# Patient Record
Sex: Female | Born: 1950 | Race: Black or African American | Hispanic: No | Marital: Single | Smoking: Never smoker
Health system: Southern US, Community
[De-identification: ages and names within clinical notes are randomized; demographics above are authoritative.]

## PROBLEM LIST (undated history)

## (undated) DIAGNOSIS — M1990 Unspecified osteoarthritis, unspecified site: Secondary | ICD-10-CM

## (undated) DIAGNOSIS — M109 Gout, unspecified: Secondary | ICD-10-CM

## (undated) HISTORY — PX: TONSILLECTOMY: SUR1361

## (undated) HISTORY — DX: Unspecified osteoarthritis, unspecified site: M19.90

---

## 2011-09-01 ENCOUNTER — Other Ambulatory Visit: Payer: Self-pay

## 2011-09-01 ENCOUNTER — Emergency Department (HOSPITAL_BASED_OUTPATIENT_CLINIC_OR_DEPARTMENT_OTHER)
Admission: EM | Admit: 2011-09-01 | Discharge: 2011-09-01 | Disposition: A | Discharge status: Home / Self Care | Payer: BC Managed Care – PPO | Attending: Emergency Medicine | Admitting: Emergency Medicine

## 2011-09-01 ENCOUNTER — Encounter: Payer: Self-pay | Admitting: *Deleted

## 2011-09-01 DIAGNOSIS — R112 Nausea with vomiting, unspecified: Secondary | ICD-10-CM | POA: Insufficient documentation

## 2011-09-01 DIAGNOSIS — R197 Diarrhea, unspecified: Secondary | ICD-10-CM | POA: Insufficient documentation

## 2011-09-01 DIAGNOSIS — R42 Dizziness and giddiness: Secondary | ICD-10-CM | POA: Insufficient documentation

## 2011-09-01 LAB — URINALYSIS, ROUTINE W REFLEX MICROSCOPIC
Glucose, UA: NEGATIVE mg/dL
Hgb urine dipstick: NEGATIVE
Leukocytes, UA: NEGATIVE
Protein, ur: NEGATIVE mg/dL
Specific Gravity, Urine: 1.011 (ref 1.005–1.030)
pH: 6 (ref 5.0–8.0)

## 2011-09-01 LAB — BASIC METABOLIC PANEL
CO2: 27 mEq/L (ref 19–32)
Calcium: 9.1 mg/dL (ref 8.4–10.5)
Chloride: 105 mEq/L (ref 96–112)
Glucose, Bld: 104 mg/dL — ABNORMAL HIGH (ref 70–99)
Potassium: 3.7 mEq/L (ref 3.5–5.1)
Sodium: 140 mEq/L (ref 135–145)

## 2011-09-01 LAB — CBC
Hemoglobin: 13.6 g/dL (ref 12.0–15.0)
Platelets: 178 10*3/uL (ref 150–400)
RBC: 4.61 MIL/uL (ref 3.87–5.11)
WBC: 6.1 10*3/uL (ref 4.0–10.5)

## 2011-09-01 MED ORDER — ONDANSETRON HCL 4 MG/2ML IJ SOLN
4.0000 mg | Freq: Once | INTRAMUSCULAR | Status: AC
  Administered 2011-09-01: 4 mg via INTRAVENOUS
  Filled 2011-09-01: qty 2

## 2011-09-01 MED ORDER — SODIUM CHLORIDE 0.9 % IV SOLN
Freq: Once | INTRAVENOUS | Status: AC
  Administered 2011-09-01: 15:00:00 via INTRAVENOUS

## 2011-09-01 MED ORDER — MECLIZINE HCL 25 MG PO TABS: 25.0000 mg | ORAL_TABLET | Freq: Four times a day (QID) | ORAL | Status: AC

## 2011-09-01 MED ORDER — ONDANSETRON HCL 4 MG PO TABS: 4.0000 mg | ORAL_TABLET | Freq: Four times a day (QID) | ORAL | Status: AC

## 2011-09-01 MED ORDER — MECLIZINE HCL 25 MG PO TABS
25.0000 mg | ORAL_TABLET | Freq: Once | ORAL | Status: AC
  Administered 2011-09-01: 25 mg via ORAL
  Filled 2011-09-01: qty 1

## 2011-09-01 NOTE — ED Provider Notes (Signed)
History     CSN: [id number] Arrival date & time: 09/01/2011  1:22 PM   First MD Initiated Contact with Patient 09/01/11 1506      Chief Complaint  Patient presents with  . Nausea  . Dizziness    (Consider location/radiation/quality/duration/timing/severity/associated sxs/prior treatment) HPI Comments: Pt c/o nause and vomiting x 6 and diarrhea x 2 since this AM which started  After feeling dizzy.  She attributes her sxs to going out to eat at a [employer] last PM with many co-workers.  She has not contacted any of them to see if they are also sick.  She denies any fever.  URI sxs ~ 2 weeks ago.  She has no other sxs.  The history is provided by the patient. No language interpreter was used.    History reviewed. No pertinent past medical history.  History reviewed. No pertinent past surgical history.  No family history on file.  History  Substance Use Topics  . Smoking status: Never Smoker   . Smokeless tobacco: Not on file  . Alcohol Use: No    OB History    Grav Para Term Preterm Abortions TAB SAB Ect Mult Living                  Review of Systems  Constitutional: Negative for fever and diaphoresis.  Cardiovascular: Negative for chest pain, palpitations and leg swelling.  Gastrointestinal: Positive for nausea, vomiting and diarrhea.  Neurological: Positive for dizziness. Negative for syncope, speech difficulty and weakness.    Allergies  Review of patient's allergies indicates no known allergies.  Home Medications  No current outpatient prescriptions on file.  BP 127/66  Pulse 50  Temp(Src) 97.4 F (36.3 C) (Oral)  Resp 20  Ht 5\' 8"  (1.727 m)  Wt 221 lb (100.245 kg)  BMI 33.60 kg/m2  SpO2 100%  Physical Exam  Nursing note and vitals reviewed. Constitutional: She is oriented to person, place, and time. She appears well-developed and well-nourished. No distress.  HENT:  Head: Normocephalic and atraumatic.  Eyes: EOM are normal.  Neck:  Normal range of motion.  Cardiovascular: Regular rhythm, S1 normal, S2 normal, normal heart sounds, intact distal pulses and normal pulses.   No extrasystoles are present. Bradycardia present.  PMI is not displaced.   Pulmonary/Chest: Effort normal and breath sounds normal. No respiratory distress. She has no wheezes. She has no rales. She exhibits no tenderness.  Abdominal: Soft. She exhibits no distension and no mass. There is no tenderness. There is no rebound and no guarding.  Musculoskeletal: Normal range of motion.  Neurological: She is alert and oriented to person, place, and time. No cranial nerve deficit or sensory deficit. Coordination normal. GCS eye subscore is 4. GCS verbal subscore is 5. GCS motor subscore is 6.  Skin: Skin is warm and dry.  Psychiatric: She has a normal mood and affect. Judgment normal.    ED Course  Procedures (including critical care time)  Labs Reviewed - No data to display No results found.   No diagnosis found.   Date: 09/01/2011  Rate: 58  Rhythm: sinus bradycardia  QRS Axis: normal  Intervals: normal  ST/T Wave abnormalities: normal  Conduction Disutrbances:none  Narrative Interpretation:   Old EKG Reviewed: none available    MDM   6:16 PMdiscussed lab work with pt.  If her sxs worsen or change she is to return here or see her MD.        Worthy Rancher, PA  09/01/11 1817 

## 2011-09-01 NOTE — ED Notes (Signed)
MD at bedside. Al Decant, PAC in to talk with pt.)

## 2011-09-01 NOTE — ED Notes (Signed)
Pt states ate at [employer] yesterday for lunch- states had dizziness this morning and with loose BM then had n/v since this morning- not sure if it is something she ate

## 2011-09-01 NOTE — ED Notes (Signed)
Pt ambulated to bathroom without difficulty. CCU/A obtained.

## 2011-09-01 NOTE — ED Notes (Signed)
MD at bedside. Raiford Noble, PA in with pt)

## 2011-09-01 NOTE — ED Notes (Signed)
MD at bedside. 

## 2011-09-01 NOTE — ED Provider Notes (Signed)
Medical screening examination/treatment/procedure(s) were performed by non-physician practitioner and as supervising physician I was immediately available for consultation/collaboration.  Kiersten Coss, MD 09/01/11 2350 

## 2015-07-07 ENCOUNTER — Ambulatory Visit (INDEPENDENT_AMBULATORY_CARE_PROVIDER_SITE_OTHER): Payer: BC Managed Care – PPO | Admitting: Internal Medicine

## 2015-07-07 ENCOUNTER — Encounter: Payer: Self-pay | Admitting: Internal Medicine

## 2015-07-07 VITALS — BP 116/78 | HR 82 | Temp 99.0°F | Wt 207.0 lb

## 2015-07-07 DIAGNOSIS — M10072 Idiopathic gout, left ankle and foot: Secondary | ICD-10-CM

## 2015-07-07 NOTE — Progress Notes (Signed)
HPI  Pt presents to the clinic today to establish care and for management of the conditions listed below. She is transferring care from [facility] [place].  Flu: never Tetanus: 2014 Zostovax: never Pap Smear: 04/2014 Mammogram: 04/2014 Colon Screening: 04/2014 Vision Screening: as needed Dentist: as needed  Arthritis: From the gout in her left ankle. She has taken Indomethacin intermittently over the last year.  She also c/o left ankle pain and swelling. This started about 1 month ago. She did see her previous PCP for the same and was diagnosed with gout. She was given Prednisone, Zanaflex and Colchicine. She reports she only took the Colchicine twice and never took the Prednisone or Zanaflex because she was concerned about the side effects. The pain is getting worse and interfering with her ability to walk. The pain is worse with weight bearing and she can barely stand to put her shoe on.  Past Medical History  Diagnosis Date  . Arthritis     Current Outpatient Prescriptions  Medication Sig Dispense Refill  . colchicine 0.6 MG tablet Take 1.2 mg by mouth daily. Take 2 tablets upon acute onset    . indomethacin (INDOCIN) 25 MG capsule Take 25-50 mg by mouth 2 (two) times daily with a meal. Every 6 to 8 hours with food    . naproxen (NAPROSYN) 500 MG tablet TAKE 1 TABLET AS NEEDED EVERY 12 HRS WITH FOOD FOR 14 DAYS  0  . predniSONE (DELTASONE) 20 MG tablet Take 20 mg by mouth daily with breakfast. Take 3 tabs x 1 day, then 2 tabs x 3 days, 1 x 3, 1/2 x 2 then daily    . tiZANidine (ZANAFLEX) 4 MG tablet TAKE 1 TABLET AS NEEDED AT BEDTIME  1   No current facility-administered medications for this visit.    No Known Allergies  Family History  Problem Relation Age of Onset  . Arthritis Mother   . Kidney disease Mother   . Lung cancer Father   . Stroke Sister   . Hypertension Sister   . Lung cancer Brother   . Heart disease Brother     Social History   Social History   . Marital Status: Single    Spouse Name: N/A  . Number of Children: N/A  . Years of Education: N/A   Occupational History  . Not on file.   Social History Main Topics  . Smoking status: Never Smoker   . Smokeless tobacco: Not on file  . Alcohol Use: No  . Drug Use: No  . Sexual Activity: Not on file   Other Topics Concern  . Not on file   Social History Narrative    ROS:  Constitutional: Denies fever, malaise, fatigue, headache or abrupt weight changes.  Respiratory: Denies difficulty breathing, shortness of breath, cough or sputum production.   Cardiovascular: Denies chest pain, chest tightness, palpitations or swelling in the hands or feet.  Musculoskeletal: Pt reports joint pain and swelling.  Skin: Denies redness, rashes, lesions or ulcercations.  Neurological: Denies dizziness, difficulty with memory, difficulty with speech or problems with balance and coordination.  Psych: Denies anxiety, depression, SI/HI.  No other specific complaints in a complete review of systems (except as listed in HPI above).  PE: BP 116/78 mmHg  Pulse 82  Temp(Src) 99 F (37.2 C) (Oral)  Wt 207 lb (93.895 kg)  SpO2 98%  Wt Readings from Last 3 Encounters:  07/07/15 207 lb (93.895 kg)  09/01/11 221 lb (100.245 kg)  General: Appears her stated age, well developed, well nourished in NAD. Cardiovascular: Normal rate and rhythm. S1,S2 noted.  No murmur, rubs or gallops noted. Pedal pulse 2+ bilaterally.  Pulmonary/Chest: Normal effort and positive vesicular breath sounds. No respiratory distress. No wheezes, rales or ronchi noted.  Musculoskeletal: Decreased flexion, extension and rotation of the left ankle. 1+ swelling noted. Very tender to palpation. Her gait is slowed and she is walking with a limp. Neurological: Alert and oriented. Sensation intact to BLE. Psychiatric: She seems anxious today.  BMET    Component Value Date/Time   NA 140 09/01/2011 1524   K 3.7 09/01/2011 1524    CL 105 09/01/2011 1524   CO2 27 09/01/2011 1524   GLUCOSE 104* 09/01/2011 1524   BUN 12 09/01/2011 1524   CREATININE 0.90 09/01/2011 1524   CALCIUM 9.1 09/01/2011 1524   GFRNONAA 69* 09/01/2011 1524   GFRAA 80* 09/01/2011 1524    Lipid Panel  No results found for: CHOL, TRIG, HDL, CHOLHDL, VLDL, LDLCALC  CBC    Component Value Date/Time   WBC 6.1 09/01/2011 1524   RBC 4.61 09/01/2011 1524   HGB 13.6 09/01/2011 1524   HCT 40.0 09/01/2011 1524   PLT 178 09/01/2011 1524   MCV 86.8 09/01/2011 1524   MCH 29.5 09/01/2011 1524   MCHC 34.0 09/01/2011 1524   RDW 12.8 09/01/2011 1524    Hgb A1C No results found for: HGBA1C   Assessment and Plan:  Gout of left ankle:  I wanted to get a xray today but she declines as she had one last year from previous PCP, will request records Discussed the importance of taking the Prednisone to improve the pain in her ankles Once the pain is improved, we can check uric acid level and discuss other long term treatment options Keep the left ankle elevated  RTC in 2 week or if symptoms persist or worsen

## 2015-07-07 NOTE — Progress Notes (Signed)
Pre visit review using our clinic review tool, if applicable. No additional management support is needed unless otherwise documented below in the visit note. 

## 2015-07-07 NOTE — Patient Instructions (Signed)
Gout °Gout is when your joints become red, sore, and swell (inflamed). This is caused by the buildup of uric acid crystals in the joints. Uric acid is a chemical that is normally in the blood. If the level of uric acid gets too high in the blood, these crystals form in your joints and tissues. Over time, these crystals can form into masses near the joints and tissues. These masses can destroy bone and cause the bone to look misshapen (deformed). °HOME CARE  °· Do not take aspirin for pain. °· Only take medicine as told by your doctor. °· Rest the joint as much as you can. When in bed, keep sheets and blankets off painful areas. °· Keep the sore joints raised (elevated). °· Put warm or cold packs on painful joints. Use of warm or cold packs depends on which works best for you. °· Use crutches if the painful joint is in your leg. °· Drink enough fluids to keep your pee (urine) clear or pale yellow. Limit alcohol, sugary drinks, and drinks with fructose in them. °· Follow your diet instructions. Pay careful attention to how much protein you eat. Include fruits, vegetables, whole grains, and fat-free or low-fat milk products in your daily diet. Talk to your doctor or dietitian about the use of coffee, vitamin C, and cherries. These may help lower uric acid levels. °· Keep a healthy body weight. °GET HELP RIGHT AWAY IF:  °· You have watery poop (diarrhea), throw up (vomit), or have any side effects from medicines. °· You do not feel better in 24 hours, or you are getting worse. °· Your joint becomes suddenly more tender, and you have chills or a fever. °MAKE SURE YOU:  °· Understand these instructions. °· Will watch your condition. °· Will get help right away if you are not doing well or get worse. °Document Released: 07/31/2008 Document Revised: 03/08/2014 Document Reviewed: 06/04/2012 °ExitCare® Patient Information ©2015 [employer]. This information is not intended to replace advice given to you by your health care  provider. Make sure you discuss any questions you have with your health care provider. ° °

## 2015-07-18 ENCOUNTER — Encounter: Payer: Self-pay | Admitting: Internal Medicine

## 2015-07-18 ENCOUNTER — Ambulatory Visit (INDEPENDENT_AMBULATORY_CARE_PROVIDER_SITE_OTHER): Payer: BC Managed Care – PPO | Admitting: Internal Medicine

## 2015-07-18 VITALS — BP 118/70 | HR 63 | Temp 98.3°F | Wt 202.5 lb

## 2015-07-18 DIAGNOSIS — M10072 Idiopathic gout, left ankle and foot: Secondary | ICD-10-CM

## 2015-07-18 LAB — COMPREHENSIVE METABOLIC PANEL
ALBUMIN: 4 g/dL (ref 3.5–5.2)
ALK PHOS: 80 U/L (ref 39–117)
ALT: 18 U/L (ref 0–35)
AST: 19 U/L (ref 0–37)
BUN: 18 mg/dL (ref 6–23)
CALCIUM: 9.6 mg/dL (ref 8.4–10.5)
CHLORIDE: 104 meq/L (ref 96–112)
CO2: 30 mEq/L (ref 19–32)
Creatinine, Ser: 1.09 mg/dL (ref 0.40–1.20)
GFR: 65.35 mL/min (ref >60.00)
Glucose, Bld: 62 mg/dL — ABNORMAL LOW (ref 70–99)
POTASSIUM: 4.8 meq/L (ref 3.5–5.1)
SODIUM: 139 meq/L (ref 135–145)
TOTAL PROTEIN: 7.3 g/dL (ref 6.0–8.3)
Total Bilirubin: 0.9 mg/dL (ref 0.2–1.2)

## 2015-07-18 LAB — URIC ACID: URIC ACID, SERUM: 6.1 mg/dL (ref 2.4–7.0)

## 2015-07-18 NOTE — Progress Notes (Signed)
Subjective:    Patient ID: Shirley Thornton, female    DOB: 12-08-1950, 64 y.o.   MRN: [id number]  HPI  Pt presents to the clinic today to follow up pain in her left ankle. She was told that she had gout in her foot. She did not want to take the Prednisone or Colchicine that was prescribed by UC because she was concerned about the side effects. I advised her to take the Prednisone as prescribed. She reports that her left ankle is feeling much better. She also started on a low purine diet.  Review of Systems  Past Medical History  Diagnosis Date  . Arthritis     Current Outpatient Prescriptions  Medication Sig Dispense Refill  . indomethacin (INDOCIN) 25 MG capsule Take 25-50 mg by mouth 2 (two) times daily with a meal. Every 6 to 8 hours with food     No current facility-administered medications for this visit.    No Known Allergies  Family History  Problem Relation Age of Onset  . Arthritis Mother   . Kidney disease Mother   . Lung cancer Father   . Stroke Sister   . Hypertension Sister   . Lung cancer Brother   . Heart disease Brother     Social History   Social History  . Marital Status: Single    Spouse Name: N/A  . Number of Children: N/A  . Years of Education: N/A   Occupational History  . Not on file.   Social History Main Topics  . Smoking status: Never Smoker   . Smokeless tobacco: Not on file  . Alcohol Use: No  . Drug Use: No  . Sexual Activity: Not Currently   Other Topics Concern  . Not on file   Social History Narrative     Constitutional: Denies fever, malaise, fatigue, headache or abrupt weight changes.  Respiratory: Denies difficulty breathing, shortness of breath, cough or sputum production.   Cardiovascular: Denies chest pain, chest tightness, palpitations or swelling in the hands or feet.  Musculoskeletal: Denies decreased range of motion, difficulty with gait, muscle pain or joint pain and swelling.  Skin: Denies redness, rashes,  lesions or ulcercations.   No other specific complaints in a complete review of systems (except as listed in HPI above).     Objective:   Physical Exam  BP 118/70 mmHg  Pulse 63  Temp(Src) 98.3 F (36.8 C) (Oral)  Wt 202 lb 8 oz (91.853 kg)  SpO2 98% Wt Readings from Last 3 Encounters:  07/18/15 202 lb 8 oz (91.853 kg)  07/07/15 207 lb (93.895 kg)  09/01/11 221 lb (100.245 kg)    General: Appears her stated age, well developed, well nourished in NAD. Skin: Warm, dry and intact. No rashes, lesions or ulcerations noted. Cardiovascular: Normal rate and rhythm. S1,S2 noted.  No murmur, rubs or gallops noted.  Pulmonary/Chest: Normal effort and positive vesicular breath sounds. No respiratory distress. No wheezes, rales or ronchi noted.  Musculoskeletal: Normal flexion, extension and rotation of the left ankle. No signs of joint swelling. No difficulty with gait.  Neurological: Alert and oriented. Sensation intact to BLE.   BMET    Component Value Date/Time   NA 140 09/01/2011 1524   K 3.7 09/01/2011 1524   CL 105 09/01/2011 1524   CO2 27 09/01/2011 1524   GLUCOSE 104* 09/01/2011 1524   BUN 12 09/01/2011 1524   CREATININE 0.90 09/01/2011 1524   CALCIUM 9.1 09/01/2011 1524   GFRNONAA 69*  09/01/2011 1524   GFRAA 80* 09/01/2011 1524    Lipid Panel  No results found for: CHOL, TRIG, HDL, CHOLHDL, VLDL, LDLCALC  CBC    Component Value Date/Time   WBC 6.1 09/01/2011 1524   RBC 4.61 09/01/2011 1524   HGB 13.6 09/01/2011 1524   HCT 40.0 09/01/2011 1524   PLT 178 09/01/2011 1524   MCV 86.8 09/01/2011 1524   MCH 29.5 09/01/2011 1524   MCHC 34.0 09/01/2011 1524   RDW 12.8 09/01/2011 1524    Hgb A1C No results found for: HGBA1C       Assessment & Plan:   Gout of left ankle:  Improved Will check uric acid level and BMET today Handout given on a low purine diet She has Indomethacin on hand if symptoms flare up again  Make an appt on your way out for your annual  exam

## 2015-07-18 NOTE — Patient Instructions (Signed)
Low-Purine Diet  Purines are compounds that affect the level of uric acid in your body. A low-purine diet is a diet that is low in purines. Eating a low-purine diet can prevent the level of uric acid in your body from getting too high and causing gout or kidney stones or both.  WHAT DO I NEED TO KNOW ABOUT THIS DIET?  · Choose low-purine foods. Examples of low-purine foods are listed in the next section.  · Drink plenty of fluids, especially water. Fluids can help remove uric acid from your body. Try to drink 8-16 cups (1.9-3.8 L) a day.  · Limit foods high in fat, especially saturated fat, as fat makes it harder for the body to get rid of uric acid. Foods high in saturated fat include pizza, cheese, ice cream, whole milk, fried foods, and gravies. Choose foods that are lower in fat and lean sources of protein. Use olive oil when cooking as it contains healthy fats that are not high in saturated fat.  · Limit alcohol. Alcohol interferes with the elimination of uric acid from your body. If you are having a gout attack, avoid all alcohol.  · Keep in mind that different people's bodies react differently to different foods. You will probably learn over time which foods do or do not affect you. If you discover that a food tends to cause your gout to flare up, avoid eating that food. You can more freely enjoy foods that do not cause problems. If you have any questions about a food item, talk to your dietitian or health care provider.  WHICH FOODS ARE LOW, MODERATE, AND HIGH IN PURINES?  The following is a list of foods that are low, moderate, and high in purines. You can eat any amount of the foods that are low in purines. You may be able to have small amounts of foods that are moderate in purines. Ask your health care provider how much of a food moderate in purines you can have. Avoid foods high in purines.  Grains  · Foods low in purines: Enriched white bread, pasta, rice, cake, cornbread, popcorn.  · Foods moderate in  purines: Whole-grain breads and cereals, wheat germ, bran, oatmeal. Uncooked oatmeal. Dry wheat bran or wheat germ.  · Foods high in purines: Pancakes, French toast, biscuits, muffins.  Vegetables  · Foods low in purines: All vegetables, except those that are moderate in purines.  · Foods moderate in purines: Asparagus, cauliflower, spinach, mushrooms, green peas.  Fruits  · All fruits are low in purines.  Meats and other Protein Foods  · Foods low in purines: Eggs, nuts, peanut butter.  · Foods moderate in purines: 80-90% lean beef, lamb, veal, pork, poultry, fish, eggs, peanut butter, nuts. Crab, lobster, oysters, and shrimp. Cooked dried beans, peas, and lentils.  · Foods high in purines: Anchovies, sardines, herring, mussels, tuna, codfish, scallops, trout, and haddock. Bacon. Organ meats (such as liver or kidney). Tripe. Game meat. Goose. Sweetbreads.  Dairy  · All dairy foods are low in purines. Low-fat and fat-free dairy products are best because they are low in saturated fat.  Beverages  · Drinks low in purines: Water, carbonated beverages, tea, coffee, cocoa.  · Drinks moderate in purines: Soft drinks and other drinks sweetened with high-fructose corn syrup. Juices. To find whether a food or drink is sweetened with high-fructose corn syrup, look at the ingredients list.  · Drinks high in purines: Alcoholic beverages (such as beer).  Condiments  · Foods   low in purines: Salt, herbs, olives, pickles, relishes, vinegar.  · Foods moderate in purines: Butter, margarine, oils, mayonnaise.  Fats and Oils  · Foods low in purines: All types, except gravies and sauces made with meat.  · Foods high in purines: Gravies and sauces made with meat.  Other Foods  · Foods low in purines: Sugars, sweets, gelatin. Cake. Soups made without meat.  · Foods moderate in purines: Meat-based or fish-based soups, broths, or bouillons. Foods and drinks sweetened with high-fructose corn syrup.  · Foods high in purines: High-fat desserts  (such as ice cream, cookies, cakes, pies, doughnuts, and chocolate).  Contact your dietitian for more information on foods that are not listed here.  Document Released: 02/16/2011 Document Revised: 10/27/2013 Document Reviewed: 09/28/2013  ExitCare® Patient Information ©2015 [employer]. This information is not intended to replace advice given to you by your health care provider. Make sure you discuss any questions you have with your health care provider.

## 2015-07-18 NOTE — Progress Notes (Signed)
Pre visit review using our clinic review tool, if applicable. No additional management support is needed unless otherwise documented below in the visit note. 

## 2015-07-29 ENCOUNTER — Telehealth: Payer: Self-pay | Admitting: Internal Medicine

## 2015-07-29 MED ORDER — INDOMETHACIN 25 MG PO CAPS: 25.0000 mg | ORAL_CAPSULE | Freq: Three times a day (TID) | ORAL | Status: DC

## 2015-07-29 NOTE — Telephone Encounter (Addendum)
Spoke to pt who states that she is out of indomethacin and is needing Rx for TID to [facility] ([facility] location). Ok to fill per [employer]

## 2015-07-29 NOTE — Addendum Note (Signed)
Addended by: Desmond Dike on: 07/29/2015 03:51 PM   Modules accepted: Orders

## 2015-07-29 NOTE — Telephone Encounter (Signed)
She has Indomethacin. I want her to take that TID over the weekend and let me know if not improved on Monday

## 2015-07-29 NOTE — Telephone Encounter (Signed)
Spoke to pt and advised per [place]. Pt states that she is also taking an additional medication that she was prescribed, before establishing with Rene Kocher, but is unaware of the name. She states that she read that one of them could cause heart attack and stroke and is wanting to confirm before taking. She is going to check in her car and contact office back.

## 2015-07-29 NOTE — Telephone Encounter (Signed)
[facility] - Client TELEPHONE ADVICE RECORD [facility] Patient Name: Shirley Thornton DOB: 03-Nov-1951 Initial Comment Caller states she is having problems with her gout on her left ankle. Nurse Assessment Nurse: Roderic Ovens, RN, Amy Date/Time Lamount Cohen Time): 07/29/2015 1:58:02 PM Confirm and document reason for call. If symptomatic, describe symptoms. ---SHE STATES THAT SHE IS LIMPING AND SHE IS NOT SURE WHAT IS GOING ON WITH HER GOUT BUT IT IS FLARED UP. IT BEGAN YESTERDAY HURTING. REDNESS AND SWELLING IN THE ANKLE. Has the patient traveled out of the country within the last 30 days? ---Not Applicable Does the patient require triage? ---Yes Related visit to physician within the last 2 weeks? ---No Does the PT have any chronic conditions? (i.e. diabetes, asthma, etc.) ---Yes List chronic conditions. ---GOUT Guidelines Guideline Title Affirmed Question Affirmed Notes Ankle Pain [1] SEVERE pain (e.g., excruciating, unable to walk) AND [2] not improved after 2 hours of pain medicine Final Disposition User See Physician within 4 Hours (or PCP triage) [place], RN, Amy Comments SHE IS WANTING SOMETHING CALLED IN FOR HER GOUT FLARE UP. 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 SHE STATES THAT SHE WILL CALL BACK AROUND 1500 TO SEE IF SHE NEEDS TO COME IN OR CAN GET SOMETHING CALL IN FOR HER. Referrals REFERRED TO PCP OFFICE Disagree/Comply: Comply

## 2015-08-08 ENCOUNTER — Encounter: Payer: Self-pay | Admitting: Internal Medicine

## 2015-08-08 ENCOUNTER — Ambulatory Visit (INDEPENDENT_AMBULATORY_CARE_PROVIDER_SITE_OTHER): Payer: BC Managed Care – PPO | Admitting: Internal Medicine

## 2015-08-08 VITALS — BP 122/80 | HR 60 | Temp 98.2°F | Wt 207.0 lb

## 2015-08-08 DIAGNOSIS — R600 Localized edema: Secondary | ICD-10-CM | POA: Diagnosis not present

## 2015-08-08 NOTE — Patient Instructions (Signed)
Peripheral Edema °You have swelling in your legs (peripheral edema). This swelling is due to excess accumulation of salt and water in your body. Edema may be a sign of heart, kidney or liver disease, or a side effect of a medication. It may also be due to problems in the leg veins. Elevating your legs and using special support stockings may be very helpful, if the cause of the swelling is due to poor venous circulation. Avoid long periods of standing, whatever the cause. °Treatment of edema depends on identifying the cause. Chips, pretzels, pickles and other salty foods should be avoided. Restricting salt in your diet is almost always needed. Water pills (diuretics) are often used to remove the excess salt and water from your body via urine. These medicines prevent the kidney from reabsorbing sodium. This increases urine flow. °Diuretic treatment may also result in lowering of potassium levels in your body. Potassium supplements may be needed if you have to use diuretics daily. Daily weights can help you keep track of your progress in clearing your edema. You should call your caregiver for follow up care as recommended. °SEEK IMMEDIATE MEDICAL CARE IF:  °· You have increased swelling, pain, redness, or heat in your legs. °· You develop shortness of breath, especially when lying down. °· You develop chest or abdominal pain, weakness, or fainting. °· You have a fever. °Document Released: 11/29/2004 Document Revised: 01/14/2012 Document Reviewed: 11/09/2009 °ExitCare® Patient Information ©2015 [employer]. This information is not intended to replace advice given to you by your health care provider. Make sure you discuss any questions you have with your health care provider. ° °

## 2015-08-08 NOTE — Progress Notes (Signed)
Subjective:    Patient ID: Shirley Thornton, female    DOB: 09-16-1951, 64 y.o.   MRN: [id number]  HPI  Pt presents to the clinic today with c/o left leg swelling. She thinks she is having another gout flare. She reports she was taking the Indomethacin which did provide some relief. After she stopped the Indomethacin, her symptoms returned. She has left ankle swelling. It seems worse at the end of the day. She has put an ice pack on her foot and kept it elevated with minimal relief.  Review of Systems  Past Medical History  Diagnosis Date  . Arthritis     Current Outpatient Prescriptions  Medication Sig Dispense Refill  . indomethacin (INDOCIN) 25 MG capsule Take 1-2 capsules (25-50 mg total) by mouth 3 (three) times daily with meals. Every 6 to 8 hours with food 90 capsule 0   No current facility-administered medications for this visit.    No Known Allergies  Family History  Problem Relation Age of Onset  . Arthritis Mother   . Kidney disease Mother   . Lung cancer Father   . Stroke Sister   . Hypertension Sister   . Lung cancer Brother   . Heart disease Brother     Social History   Social History  . Marital Status: Single    Spouse Name: N/A  . Number of Children: N/A  . Years of Education: N/A   Occupational History  . Not on file.   Social History Main Topics  . Smoking status: Never Smoker   . Smokeless tobacco: Not on file  . Alcohol Use: No  . Drug Use: No  . Sexual Activity: Not Currently   Other Topics Concern  . Not on file   Social History Narrative     Constitutional: Denies fever, malaise, fatigue, headache or abrupt weight changes.  Respiratory: Denies difficulty breathing, shortness of breath, cough or sputum production.   Cardiovascular: Pt reports swelling in her left leg. Denies chest pain, chest tightness, palpitations or swelling in the hands.  Musculoskeletal: Pt reports left ankle swelling. Denies decrease in range of motion,  difficulty with gait, muscle pain or joint pain.  Skin: Denies redness, rashes, lesions or ulcercations.   No other specific complaints in a complete review of systems (except as listed in HPI above).     Objective:   Physical Exam  Pulse 60  Temp(Src) 98.2 F (36.8 C) (Oral)  Wt 207 lb (93.895 kg)  SpO2 98%  Wt Readings from Last 3 Encounters:  08/08/15 207 lb (93.895 kg)  07/18/15 202 lb 8 oz (91.853 kg)  07/07/15 207 lb (93.895 kg)    General: Appears her stated age, well developed, well nourished in NAD. Heart: Regular rate and rhythm. Pedal pulse 2+ bilaterally. Skin: Warm, dry and intact. No rashes, lesions or ulcerations noted. Musculoskeletal: Normal flexion, extension and rotation of the left ankle. 1+ swelling of the ankle. 1+ pitting pretibial edema. No difficulty with gait.    BMET    Component Value Date/Time   NA 139 07/18/2015 0946   K 4.8 07/18/2015 0946   CL 104 07/18/2015 0946   CO2 30 07/18/2015 0946   GLUCOSE 62* 07/18/2015 0946   BUN 18 07/18/2015 0946   CREATININE 1.09 07/18/2015 0946   CALCIUM 9.6 07/18/2015 0946   GFRNONAA 69* 09/01/2011 1524   GFRAA 80* 09/01/2011 1524    Lipid Panel  No results found for: CHOL, TRIG, HDL, CHOLHDL, VLDL, LDLCALC  CBC  Component Value Date/Time   WBC 6.1 09/01/2011 1524   RBC 4.61 09/01/2011 1524   HGB 13.6 09/01/2011 1524   HCT 40.0 09/01/2011 1524   PLT 178 09/01/2011 1524   MCV 86.8 09/01/2011 1524   MCH 29.5 09/01/2011 1524   MCHC 34.0 09/01/2011 1524   RDW 12.8 09/01/2011 1524    Hgb A1C No results found for: HGBA1C       Assessment & Plan:   Swelling of left leg:  eRx for 1 par of TED hose Keep legs elevated I do not think this is related to her gout Advised her to take Indomethacin only for a gout flare  RTC as needed or if symptoms persist or worsen

## 2015-08-08 NOTE — Progress Notes (Signed)
Pre visit review using our clinic review tool, if applicable. No additional management support is needed unless otherwise documented below in the visit note. 

## 2015-08-09 ENCOUNTER — Telehealth: Payer: Self-pay

## 2015-08-09 NOTE — Telephone Encounter (Signed)
Pt left vm; pt took TED rx by medical supply and pt was advised those type hose are usually given after a surgery; pt has not had surgery and wants to know if there is a different type hose pt needs for left ankle re; to gout; pt request cb.

## 2015-08-09 NOTE — Telephone Encounter (Signed)
No I want her to have TED hose.

## 2015-08-09 NOTE — Telephone Encounter (Signed)
Left message on voicemail.

## 2015-08-26 ENCOUNTER — Encounter: Payer: BC Managed Care – PPO | Admitting: Internal Medicine

## 2015-09-12 ENCOUNTER — Other Ambulatory Visit: Payer: BC Managed Care – PPO

## 2015-09-15 ENCOUNTER — Encounter: Payer: BC Managed Care – PPO | Admitting: Internal Medicine

## 2015-10-04 ENCOUNTER — Encounter: Payer: Self-pay | Admitting: Internal Medicine

## 2015-10-04 ENCOUNTER — Ambulatory Visit (INDEPENDENT_AMBULATORY_CARE_PROVIDER_SITE_OTHER)
Admission: RE | Admit: 2015-10-04 | Discharge: 2015-10-04 | Disposition: A | Discharge status: Home / Self Care | Payer: BC Managed Care – PPO | Source: Ambulatory Visit | Attending: Internal Medicine | Admitting: Internal Medicine

## 2015-10-04 ENCOUNTER — Other Ambulatory Visit: Payer: Self-pay | Admitting: Internal Medicine

## 2015-10-04 ENCOUNTER — Ambulatory Visit (INDEPENDENT_AMBULATORY_CARE_PROVIDER_SITE_OTHER): Payer: BC Managed Care – PPO | Admitting: Internal Medicine

## 2015-10-04 VITALS — BP 118/78 | HR 58 | Temp 98.2°F | Ht 66.25 in | Wt 200.0 lb

## 2015-10-04 DIAGNOSIS — Z0000 Encounter for general adult medical examination without abnormal findings: Secondary | ICD-10-CM | POA: Diagnosis not present

## 2015-10-04 DIAGNOSIS — M10072 Idiopathic gout, left ankle and foot: Secondary | ICD-10-CM

## 2015-10-04 DIAGNOSIS — H6123 Impacted cerumen, bilateral: Secondary | ICD-10-CM | POA: Diagnosis not present

## 2015-10-04 LAB — CBC
HEMATOCRIT: 41.9 % (ref 36.0–46.0)
Hemoglobin: 13.6 g/dL (ref 12.0–15.0)
MCHC: 32.5 g/dL (ref 30.0–36.0)
MCV: 90.5 fl (ref 78.0–100.0)
PLATELETS: 184 10*3/uL (ref 150.0–400.0)
RBC: 4.62 Mil/uL (ref 3.87–5.11)
RDW: 13.3 % (ref 11.5–15.5)
WBC: 3.8 10*3/uL — ABNORMAL LOW (ref 4.0–10.5)

## 2015-10-04 LAB — LIPID PANEL
CHOLESTEROL: 187 mg/dL (ref 0–200)
HDL: 49 mg/dL (ref >39.00)
LDL CALC: 127 mg/dL — AB (ref 0–99)
NonHDL: 138.43
TRIGLYCERIDES: 56 mg/dL (ref 0.0–149.0)
Total CHOL/HDL Ratio: 4
VLDL: 11.2 mg/dL (ref 0.0–40.0)

## 2015-10-04 LAB — URIC ACID: URIC ACID, SERUM: 5.7 mg/dL (ref 2.4–7.0)

## 2015-10-04 LAB — COMPREHENSIVE METABOLIC PANEL
ALBUMIN: 3.9 g/dL (ref 3.5–5.2)
ALT: 21 U/L (ref 0–35)
AST: 25 U/L (ref 0–37)
Alkaline Phosphatase: 86 U/L (ref 39–117)
BUN: 21 mg/dL (ref 6–23)
CALCIUM: 9.6 mg/dL (ref 8.4–10.5)
CHLORIDE: 105 meq/L (ref 96–112)
CO2: 31 mEq/L (ref 19–32)
Creatinine, Ser: 1.07 mg/dL (ref 0.40–1.20)
GFR: 66.72 mL/min (ref >60.00)
Glucose, Bld: 80 mg/dL (ref 70–99)
POTASSIUM: 4.7 meq/L (ref 3.5–5.1)
Sodium: 139 mEq/L (ref 135–145)
Total Bilirubin: 0.7 mg/dL (ref 0.2–1.2)
Total Protein: 7 g/dL (ref 6.0–8.3)

## 2015-10-04 LAB — HEMOGLOBIN A1C: Hgb A1c MFr Bld: 5.4 % (ref 4.6–6.5)

## 2015-10-04 NOTE — Progress Notes (Signed)
Pre visit review using our clinic review tool, if applicable. No additional management support is needed unless otherwise documented below in the visit note. 

## 2015-10-04 NOTE — Patient Instructions (Addendum)
Consider getting a shingles vaccine. We are getting an xray of your left ankle today- we will call you with the results. Wear the compression socks and/or brace when up and walking. We flushed your ears out today. You can try Debrox OTC 2 x week to prevent further wax buildup.   Menopause is a normal process in which your reproductive ability comes to an end. This process happens gradually over a span of months to years, usually between the ages of 52 and 54. Menopause is complete when you have missed 12 consecutive menstrual periods. It is important to talk with your health care provider about some of the most common conditions that affect postmenopausal women, such as heart disease, cancer, and bone loss (osteoporosis). Adopting a healthy lifestyle and getting preventive care can help to promote your health and wellness. Those actions can also lower your chances of developing some of these common conditions. WHAT SHOULD I KNOW ABOUT MENOPAUSE? During menopause, you may experience a number of symptoms, such as:  Moderate-to-severe hot flashes.  Night sweats.  Decrease in sex drive.  Mood swings.  Headaches.  Tiredness.  Irritability.  Memory problems.  Insomnia. Choosing to treat or not to treat menopausal changes is an individual decision that you make with your health care provider. WHAT SHOULD I KNOW ABOUT HORMONE REPLACEMENT THERAPY AND SUPPLEMENTS? Hormone therapy products are effective for treating symptoms that are associated with menopause, such as hot flashes and night sweats. Hormone replacement carries certain risks, especially as you become older. If you are thinking about using estrogen or estrogen with progestin treatments, discuss the benefits and risks with your health care provider. WHAT SHOULD I KNOW ABOUT HEART DISEASE AND STROKE? Heart disease, heart attack, and stroke become more likely as you age. This may be due, in part, to the hormonal changes that your body  experiences during menopause. These can affect how your body processes dietary fats, triglycerides, and cholesterol. Heart attack and stroke are both medical emergencies. There are many things that you can do to help prevent heart disease and stroke:  Have your blood pressure checked at least every 1-2 years. High blood pressure causes heart disease and increases the risk of stroke.  If you are 61-49 years old, ask your health care provider if you should take aspirin to prevent a heart attack or a stroke.  Do not use any tobacco products, including cigarettes, chewing tobacco, or electronic cigarettes. If you need help quitting, ask your health care provider.  It is important to eat a healthy diet and maintain a healthy weight.  Be sure to include plenty of vegetables, fruits, low-fat dairy products, and lean protein.  Avoid eating foods that are high in solid fats, added sugars, or salt (sodium).  Get regular exercise. This is one of the most important things that you can do for your health.  Try to exercise for at least 150 minutes each week. The type of exercise that you do should increase your heart rate and make you sweat. This is known as moderate-intensity exercise.  Try to do strengthening exercises at least twice each week. Do these in addition to the moderate-intensity exercise.  Know your numbers.Ask your health care provider to check your cholesterol and your blood glucose. Continue to have your blood tested as directed by your health care provider. WHAT SHOULD I KNOW ABOUT CANCER SCREENING? There are several types of cancer. Take the following steps to reduce your risk and to catch any cancer development as  early as possible. Breast Cancer  Practice breast self-awareness.  This means understanding how your breasts normally appear and feel.  It also means doing regular breast self-exams. Let your health care provider know about any changes, no matter how small.  If you  are 43 or older, have a clinician do a breast exam (clinical breast exam or CBE) every year. Depending on your age, family history, and medical history, it may be recommended that you also have a yearly breast X-ray (mammogram).  If you have a family history of breast cancer, talk with your health care provider about genetic screening.  If you are at high risk for breast cancer, talk with your health care provider about having an MRI and a mammogram every year.  Breast cancer (BRCA) gene test is recommended for women who have family members with BRCA-related cancers. Results of the assessment will determine the need for genetic counseling and BRCA1 and for BRCA2 testing. BRCA-related cancers include these types:  Breast. This occurs in males or females.  Ovarian.  Tubal. This may also be called fallopian tube cancer.  Cancer of the abdominal or pelvic lining (peritoneal cancer).  Prostate.  Pancreatic. Cervical, Uterine, and Ovarian Cancer Your health care provider may recommend that you be screened regularly for cancer of the pelvic organs. These include your ovaries, uterus, and vagina. This screening involves a pelvic exam, which includes checking for microscopic changes to the surface of your cervix (Pap test).  For women ages 21-65, health care providers may recommend a pelvic exam and a Pap test every three years. For women ages 107-65, they may recommend the Pap test and pelvic exam, combined with testing for human papilloma virus (HPV), every five years. Some types of HPV increase your risk of cervical cancer. Testing for HPV may also be done on women of any age who have unclear Pap test results.  Other health care providers may not recommend any screening for nonpregnant women who are considered low risk for pelvic cancer and have no symptoms. Ask your health care provider if a screening pelvic exam is right for you.  If you have had past treatment for cervical cancer or a condition  that could lead to cancer, you need Pap tests and screening for cancer for at least 20 years after your treatment. If Pap tests have been discontinued for you, your risk factors (such as having a new sexual partner) need to be reassessed to determine if you should start having screenings again. Some women have medical problems that increase the chance of getting cervical cancer. In these cases, your health care provider may recommend that you have screening and Pap tests more often.  If you have a family history of uterine cancer or ovarian cancer, talk with your health care provider about genetic screening.  If you have vaginal bleeding after reaching menopause, tell your health care provider.  There are currently no reliable tests available to screen for ovarian cancer. Lung Cancer Lung cancer screening is recommended for adults 49-35 years old who are at high risk for lung cancer because of a history of smoking. A yearly low-dose CT scan of the lungs is recommended if you:  Currently smoke.  Have a history of at least 30 pack-years of smoking and you currently smoke or have quit within the past 15 years. A pack-year is smoking an average of one pack of cigarettes per day for one year. Yearly screening should:  Continue until it has been 15 years since  you quit.  Stop if you develop a health problem that would prevent you from having lung cancer treatment. Colorectal Cancer  This type of cancer can be detected and can often be prevented.  Routine colorectal cancer screening usually begins at age 68 and continues through age 17.  If you have risk factors for colon cancer, your health care provider may recommend that you be screened at an earlier age.  If you have a family history of colorectal cancer, talk with your health care provider about genetic screening.  Your health care provider may also recommend using home test kits to check for hidden blood in your stool.  A small camera at  the end of a tube can be used to examine your colon directly (sigmoidoscopy or colonoscopy). This is done to check for the earliest forms of colorectal cancer.  Direct examination of the colon should be repeated every 5-10 years until age 42. However, if early forms of precancerous polyps or small growths are found or if you have a family history or genetic risk for colorectal cancer, you may need to be screened more often. Skin Cancer  Check your skin from head to toe regularly.  Monitor any moles. Be sure to tell your health care provider:  About any new moles or changes in moles, especially if there is a change in a mole's shape or color.  If you have a mole that is larger than the size of a pencil eraser.  If any of your family members has a history of skin cancer, especially at a young age, talk with your health care provider about genetic screening.  Always use sunscreen. Apply sunscreen liberally and repeatedly throughout the day.  Whenever you are outside, protect yourself by wearing long sleeves, pants, a wide-brimmed hat, and sunglasses. WHAT SHOULD I KNOW ABOUT OSTEOPOROSIS? Osteoporosis is a condition in which bone destruction happens more quickly than new bone creation. After menopause, you may be at an increased risk for osteoporosis. To help prevent osteoporosis or the bone fractures that can happen because of osteoporosis, the following is recommended:  If you are 88-79 years old, get at least 1,000 mg of calcium and at least 600 mg of vitamin D per day.  If you are older than age 44 but younger than age 48, get at least 1,200 mg of calcium and at least 600 mg of vitamin D per day.  If you are older than age 55, get at least 1,200 mg of calcium and at least 800 mg of vitamin D per day. Smoking and excessive alcohol intake increase the risk of osteoporosis. Eat foods that are rich in calcium and vitamin D, and do weight-bearing exercises several times each week as directed by  your health care provider. WHAT SHOULD I KNOW ABOUT HOW MENOPAUSE AFFECTS [place]? Depression may occur at any age, but it is more common as you become older. Common symptoms of depression include:  Low or sad mood.  Changes in sleep patterns.  Changes in appetite or eating patterns.  Feeling an overall lack of motivation or enjoyment of activities that you previously enjoyed.  Frequent crying spells. Talk with your health care provider if you think that you are experiencing depression. WHAT SHOULD I KNOW ABOUT IMMUNIZATIONS? It is important that you get and maintain your immunizations. These include:  Tetanus, diphtheria, and pertussis (Tdap) booster vaccine.  Influenza every year before the flu season begins.  Pneumonia vaccine.  Shingles vaccine. Your health care provider  may also recommend other immunizations.   This information is not intended to replace advice given to you by your health care provider. Make sure you discuss any questions you have with your health care provider.   Document Released: 12/14/2005 Document Revised: 11/12/2014 Document Reviewed: 06/24/2014 Elsevier Interactive Patient Education [employer].

## 2015-10-04 NOTE — Progress Notes (Signed)
Subjective:    Patient ID: Shirley Thornton, female    DOB: [birth year], 64 y.o.   MRN: [id number]  HPI  Pt presents to the clinic today for her annual exam.  Flu: never Tetanus: 02/2013 Zostovax: never, she did have chicken pox Pap Smear: 04/2014 at Dr. Abbe [place], [facility] Mammogram: 08/2015 Colon Screening: 04/2014 Vision Screening: 09/06/15 Dentist: scheduled 10/18/15  Diet: She eats lean meats, and salmon. She eats fruits and veggies daily. She tries to avoid fried foods. She drinks mostly water. She is also drinking sweet tea. Exercise: She is not exercising.  She does c/o ongoing pain in her left ankle. She does have gout. She has intermittent burning pain. She takes the Indomethacin 2-3 times per week with good relief.  Review of Systems      Past Medical History  Diagnosis Date  . Arthritis     Current Outpatient Prescriptions  Medication Sig Dispense Refill  . clobetasol (OLUX) 0.05 % topical foam Apply 1 application topically 2 (two) times daily as needed.     . indomethacin (INDOCIN) 25 MG capsule Take 1-2 capsules (25-50 mg total) by mouth 3 (three) times daily with meals. Every 6 to 8 hours with food 90 capsule 0   No current facility-administered medications for this visit.    No Known Allergies  Family History  Problem Relation Age of Onset  . Arthritis Mother   . Kidney disease Mother   . Lung cancer Father   . Stroke Sister   . Hypertension Sister   . Lung cancer Brother   . Heart disease Brother     Social History   Social History  . Marital Status: Single    Spouse Name: N/A  . Number of Children: N/A  . Years of Education: N/A   Occupational History  . Not on file.   Social History Main Topics  . Smoking status: Never Smoker   . Smokeless tobacco: Not on file  . Alcohol Use: No  . Drug Use: No  . Sexual Activity: Not Currently   Other Topics Concern  . Not on file   Social History Narrative      Constitutional: Denies fever, malaise, fatigue, headache or abrupt weight changes.  HEENT: Pt reports decreased hearing. Denies eye pain, eye redness, ear pain, ringing in the ears, wax buildup, runny nose, nasal congestion, bloody nose, or sore throat. Respiratory: Denies difficulty breathing, shortness of breath, cough or sputum production.   Cardiovascular: Denies chest pain, chest tightness, palpitations or swelling in the hands or feet.  Gastrointestinal: Denies abdominal pain, bloating, constipation, diarrhea or blood in the stool.  GU: Denies urgency, frequency, pain with urination, burning sensation, blood in urine, odor or discharge. Musculoskeletal: Pt reports left ankle pain. Denies decrease in range of motion, difficulty with gait, muscle pain or joint swelling.  Skin: Denies redness, rashes, lesions or ulcercations.  Neurological: Denies dizziness, difficulty with memory, difficulty with speech or problems with balance and coordination.  Psych: Denies anxiety, depression, SI/HI.  No other specific complaints in a complete review of systems (except as listed in HPI above).  Objective:   Physical Exam   BP 118/78 mmHg  Pulse 58  Temp(Src) 98.2 F (36.8 C) (Oral)  Ht 5' 6.25" (1.683 m)  Wt 200 lb (90.719 kg)  BMI 32.03 kg/m2  SpO2 98% Wt Readings from Last 3 Encounters:  10/04/15 200 lb (90.719 kg)  08/08/15 207 lb (93.895 kg)  07/18/15 202 lb 8 oz (  91.853 kg)    General: Appears her stated age, well developed, well nourished in NAD. Skin: Warm, dry and intact. She has multiple nevi present. HEENT: Head: normal shape and size; Eyes: sclera white, no icterus, conjunctiva pink, PERRLA and EOMs intact; Ears: bilateral cerumen impaction; Nose: mucosa pink and moist, septum midline; Throat/Mouth: Teeth present, mucosa pink and moist, no exudate, lesions or ulcerations noted.  Neck:  Neck supple, trachea midline. No masses, lumps or thyromegaly present.  Cardiovascular:  Normal rate and rhythm. S1,S2 noted.  No murmur, rubs or gallops noted. No JVD or BLE edema. No carotid bruits noted. Pulmonary/Chest: Normal effort and positive vesicular breath sounds. No respiratory distress. No wheezes, rales or ronchi noted.  Abdomen: Soft and nontender. Normal bowel sounds. No distention or masses noted. Liver, spleen and kidneys non palpable. Musculoskeletal: Normal flexion, extension and rotation of the left ankle. No signs of joint swelling. No pain with palpation. No difficulty with gait.  Neurological: Alert and oriented. Cranial nerves II-XII grossly  intact. Coordination normal.  Psychiatric: She does seem mildly anxious. She does engage and make eye contact.  BMET    Component Value Date/Time   NA 139 07/18/2015 0946   K 4.8 07/18/2015 0946   CL 104 07/18/2015 0946   CO2 30 07/18/2015 0946   GLUCOSE 62* 07/18/2015 0946   BUN 18 07/18/2015 0946   CREATININE 1.09 07/18/2015 0946   CALCIUM 9.6 07/18/2015 0946   GFRNONAA 69* 09/01/2011 1524   GFRAA 80* 09/01/2011 1524    Lipid Panel  No results found for: CHOL, TRIG, HDL, CHOLHDL, VLDL, LDLCALC  CBC    Component Value Date/Time   WBC 6.1 09/01/2011 1524   RBC 4.61 09/01/2011 1524   HGB 13.6 09/01/2011 1524   HCT 40.0 09/01/2011 1524   PLT 178 09/01/2011 1524   MCV 86.8 09/01/2011 1524   MCH 29.5 09/01/2011 1524   MCHC 34.0 09/01/2011 1524   RDW 12.8 09/01/2011 1524    Hgb A1C No results found for: HGBA1C      Assessment & Plan:   Preventative Health Maintenance:  She declines flu shot today Tetanus UTD Encouraged her to consider a Zostavax- she declines today Pap, mammogram and colonoscopy UTD- will get records of these Encouraged her to continue to see an eye doctor and dentist annually Encouraged her to consume a balanced diet and start an exercise program CBC, CMET, Lipid, A1C, HIV and Hep C today  Gout, left ankle:  Xray of left ankle today Uric acid level today  Bilateral  cerumen impaction:  Ears flushed by CMA Advised her to try Debrox OTC to prevent wax buildup  RTC in 1 year or sooner if needed

## 2015-10-05 ENCOUNTER — Encounter: Payer: Self-pay | Admitting: *Deleted

## 2015-10-05 LAB — HEPATITIS C ANTIBODY: HCV Ab: NEGATIVE

## 2015-10-05 LAB — HIV ANTIBODY (ROUTINE TESTING W REFLEX): HIV 1&2 Ab, 4th Generation: NONREACTIVE

## 2016-01-17 ENCOUNTER — Telehealth: Payer: Self-pay | Admitting: Internal Medicine

## 2016-01-17 NOTE — Telephone Encounter (Signed)
Patient would like to know if she can have her handicap sticker extended because of her gout, because it expires 3/17.

## 2016-01-19 NOTE — Telephone Encounter (Signed)
Called pt to let her know that gout is not a medical condition listed on form for handicap placard form---pt immediately became irate and stated that you told her that if she needed it extended to just call---pt yelled at me and asked me if she needed to schedule an appt or does she need to find a new provider---i offered to schedule pt an appt and told her that I will forward msg back in the meantime---pt yelled at me again asking me that she has always had gout and that she was told that all she needed to do was contact you and you would extend---please advise---I scheduled appt for Monday for pt

## 2016-01-19 NOTE — Telephone Encounter (Signed)
No, this is not indicated for a handicap sticker

## 2016-01-19 NOTE — Telephone Encounter (Signed)
Her gout has improved. She should not need extension on handicap placard

## 2016-01-23 ENCOUNTER — Ambulatory Visit: Payer: BC Managed Care – PPO | Admitting: Internal Medicine

## 2016-10-06 ENCOUNTER — Emergency Department (HOSPITAL_BASED_OUTPATIENT_CLINIC_OR_DEPARTMENT_OTHER)
Admission: EM | Admit: 2016-10-06 | Discharge: 2016-10-06 | Disposition: A | Discharge status: Home / Self Care | Payer: BC Managed Care – PPO | Attending: Emergency Medicine | Admitting: Emergency Medicine

## 2016-10-06 ENCOUNTER — Encounter (HOSPITAL_BASED_OUTPATIENT_CLINIC_OR_DEPARTMENT_OTHER): Payer: Self-pay | Admitting: *Deleted

## 2016-10-06 DIAGNOSIS — T7840XA Allergy, unspecified, initial encounter: Secondary | ICD-10-CM | POA: Insufficient documentation

## 2016-10-06 HISTORY — DX: Gout, unspecified: M10.9

## 2016-10-06 MED ORDER — DEXAMETHASONE SODIUM PHOSPHATE 10 MG/ML IJ SOLN
10.0000 mg | Freq: Once | INTRAMUSCULAR | Status: AC
  Administered 2016-10-06: 10 mg via INTRAMUSCULAR

## 2016-10-06 NOTE — ED Notes (Signed)
Patient denies pain and is resting comfortably.  

## 2016-10-06 NOTE — ED Notes (Signed)
States is having a reaction to hair dye placed on Wed. Was eval by primary on Thursday and started on zyrtec, zantac and steroid cream for scalp. Woke up today with worse swelling to face, denies SOB

## 2016-10-06 NOTE — ED Triage Notes (Addendum)
Pt c/o allergic reaction , seen by PMD yestreday for same, pt not taking med prescribed .  " I want to be free of all meds" pt taking fast and incomplete sentences No distress noted, pt refusing to answer yes or no to simple triage questions

## 2016-10-06 NOTE — ED Provider Notes (Signed)
[facility]   CSN: [id number] Arrival date & time: 10/06/16  1159     History   Chief Complaint Chief Complaint  Patient presents with  . Allergic Reaction    HPI Shirley Thornton is a 65 y.o. female.  HPI Patient presents for evaluation of allergic reaction. She had her hair done 3 days ago with a new hair dye and immediately began experiencing scalp itching and redness. The following day she had her hair shampooed which helped some. She then began experiencing headache, forehead swelling, and swelling around her eyes. She saw her PCP yesterday who prescribed her Zantac, Zyrtec, and a topical steroid cream for her scalp. She has not used the topical cream yet. She has taken one dose of Zyrtec and Zantac. She states she woke up this morning and thought that her swelling around her eyes was worse. She denies any lightheadedness, chest pain, shortness of breath, wheezing, or difficulty breathing. She denies any lip or tongue swelling.  Past Medical History:  Diagnosis Date  . Arthritis   . Gout     There are no active problems to display for this patient.   History reviewed. No pertinent surgical history.  OB History    No data available       Home Medications    Prior to Admission medications   Medication Sig Start Date End Date Taking? Authorizing Provider  indomethacin (INDOCIN) 25 MG capsule Take 1-2 capsules (25-50 mg total) by mouth 3 (three) times daily with meals. Every 6 to 8 hours with food 07/29/15   Lorre Munroeegina W Baity, NP    Family History Family History  Problem Relation Age of Onset  . Arthritis Mother   . Kidney disease Mother   . Lung cancer Father   . Stroke Sister   . Hypertension Sister   . Lung cancer Brother   . Heart disease Brother     Social History Social History  Substance Use Topics  . Smoking status: Never Smoker  . Smokeless tobacco: Not on file  . Alcohol use No     Allergies   Patient has no known  allergies.   Review of Systems Review of Systems All other systems negative unless otherwise stated in HPI   Physical Exam Updated Vital Signs BP 166/99   Pulse 78   Temp 98.9 F (37.2 C)   Resp 18   Ht 5\' 5"  (1.651 m)   Wt 95.3 kg   SpO2 100%   BMI 34.95 kg/m   Physical Exam  Constitutional: She is oriented to person, place, and time. She appears well-developed and well-nourished.  HENT:  Head: Normocephalic and atraumatic.  Right Ear: External ear normal.  Left Ear: External ear normal.  Mild periorbital swelling bilaterally without erythema, induration, or warmth. No OP swelling or tongue/lip swelling. Erythema to hairline without signs of infection.   Eyes: Conjunctivae are normal. No scleral icterus.  Neck: No tracheal deviation present.  Cardiovascular: Normal rate and regular rhythm.   Pulmonary/Chest: Effort normal and breath sounds normal. No respiratory distress.  Abdominal: She exhibits no distension.  Musculoskeletal: Normal range of motion.  Neurological: She is alert and oriented to person, place, and time.  Skin: Skin is warm and dry.  Psychiatric: She has a normal mood and affect. Her behavior is normal.     ED Treatments / Results  Labs (all labs ordered are listed, but only abnormal results are displayed) Labs Reviewed - No data to display  EKG  EKG Interpretation None       Radiology No results found.  Procedures Procedures (including critical care time)  Medications Ordered in ED Medications  dexamethasone (DECADRON) injection 10 mg (not administered)     Initial Impression / Assessment and Plan / ED Course  I have reviewed the triage vital signs and the nursing notes.  Pertinent labs & imaging results that were available during my care of the patient were reviewed by me and considered in my medical decision making (see chart for details).  Clinical Course    Patient is hemodynamically stable, in no respiratory distress, and  denies the feeling of throat closing, normal phonation. No wheezing, no vomiting, no syncope. Discussed signs and symptoms of anaphylaxis and severe allergic reaction. Pt advised to return for any worsening in symptoms or any concerns. Pt treated with Decadron in ED.  Has prescription for zantac, zyrtec, and topical steroid for hairline. Pt is to follow up with their PCP. Pt is agreeable with plan & verbalizes understanding.    Final Clinical Impressions(s) / ED Diagnoses   Final diagnoses:  Allergic reaction, initial encounter    New Prescriptions New Prescriptions   No medications on file     Cheri FowlerKayla Dare Sanger, PA-C 10/06/16 1323    Lavera Guiseana Duo Liu, MD 10/06/16 2218

## 2016-10-06 NOTE — Discharge Instructions (Signed)
Continue taking Zyrtec once daily. Take ranitidine twice daily. Start using the betamethasone dipropionate twice daily along your hairline.  Follow up with your primary care physician in the next couple of days.  Return to the ED for sudden worsening symptoms, difficulty breathing, wheezing, or any new or concerning symptoms.

## 2016-10-08 ENCOUNTER — Encounter: Payer: BC Managed Care – PPO | Admitting: Internal Medicine

## 2016-12-25 IMAGING — CR DG ANKLE COMPLETE 3+V*L*
3 series · 3 of 3 positions shown · non-contrast
Comparison: None in PACs

CLINICAL DATA: One year history of intermittent left ankle pain; no
history of trauma; there is a history of gout.

EXAM:
LEFT ANKLE COMPLETE - 3+ VIEW

[view not recorded (1 of 3)]
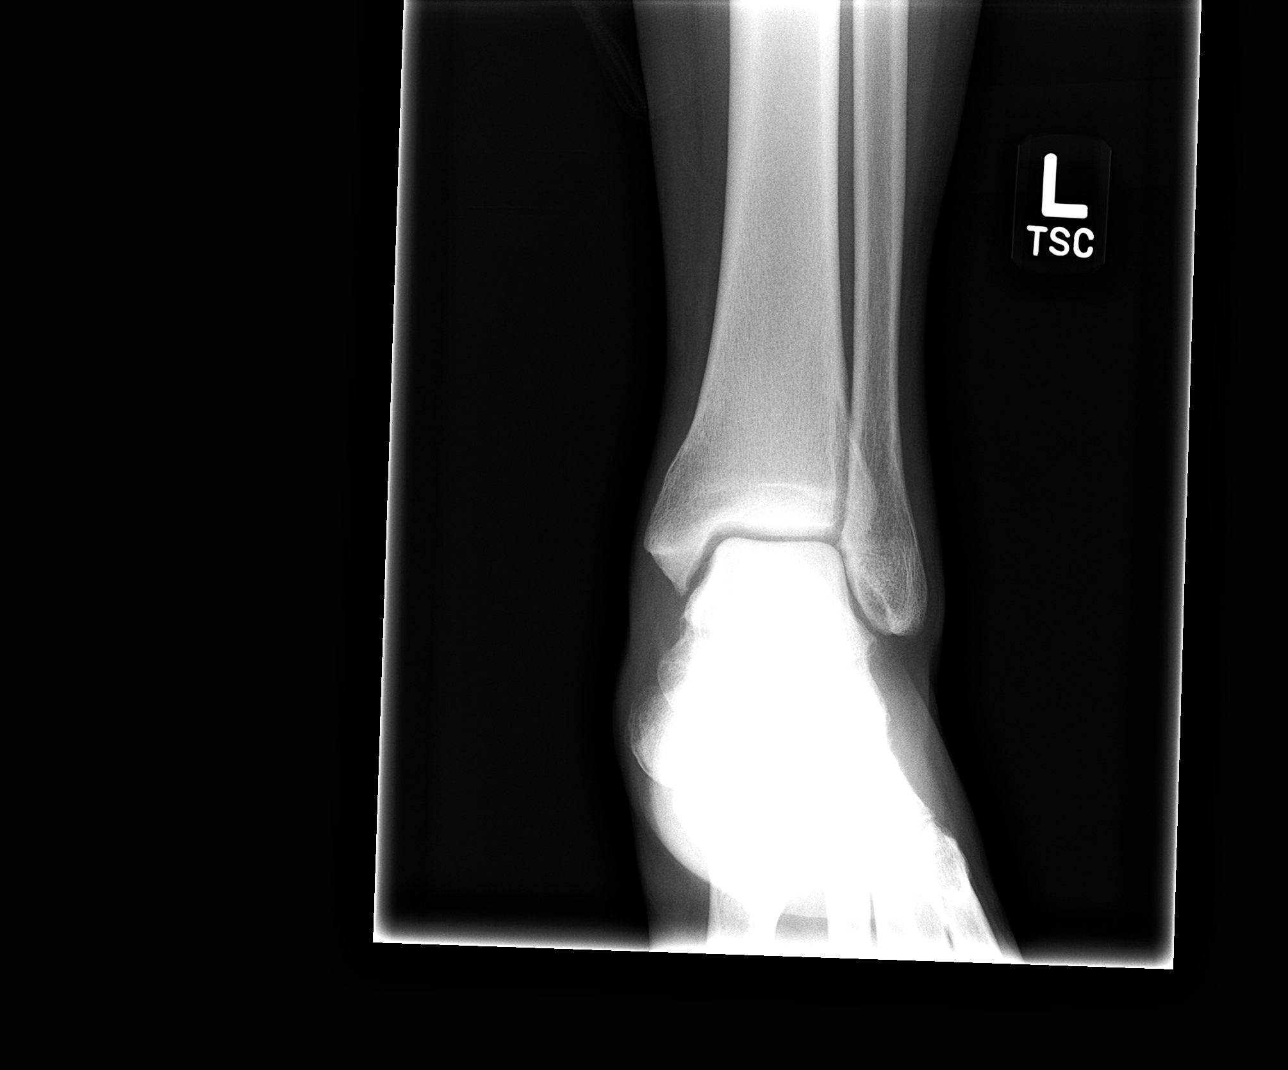

[view not recorded (2 of 3)]
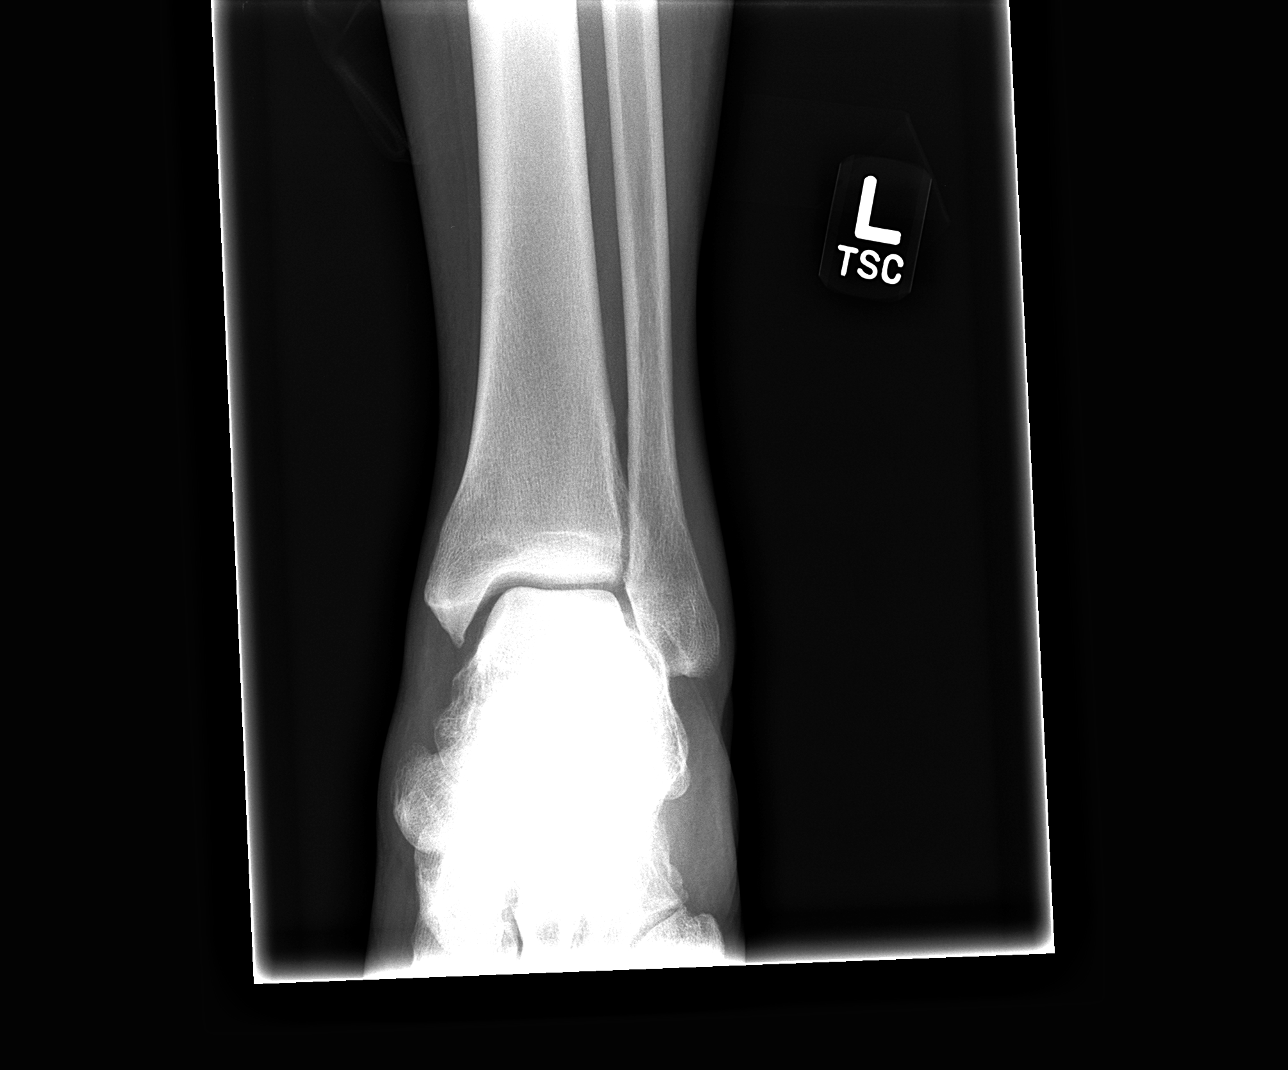

[view not recorded (3 of 3)]
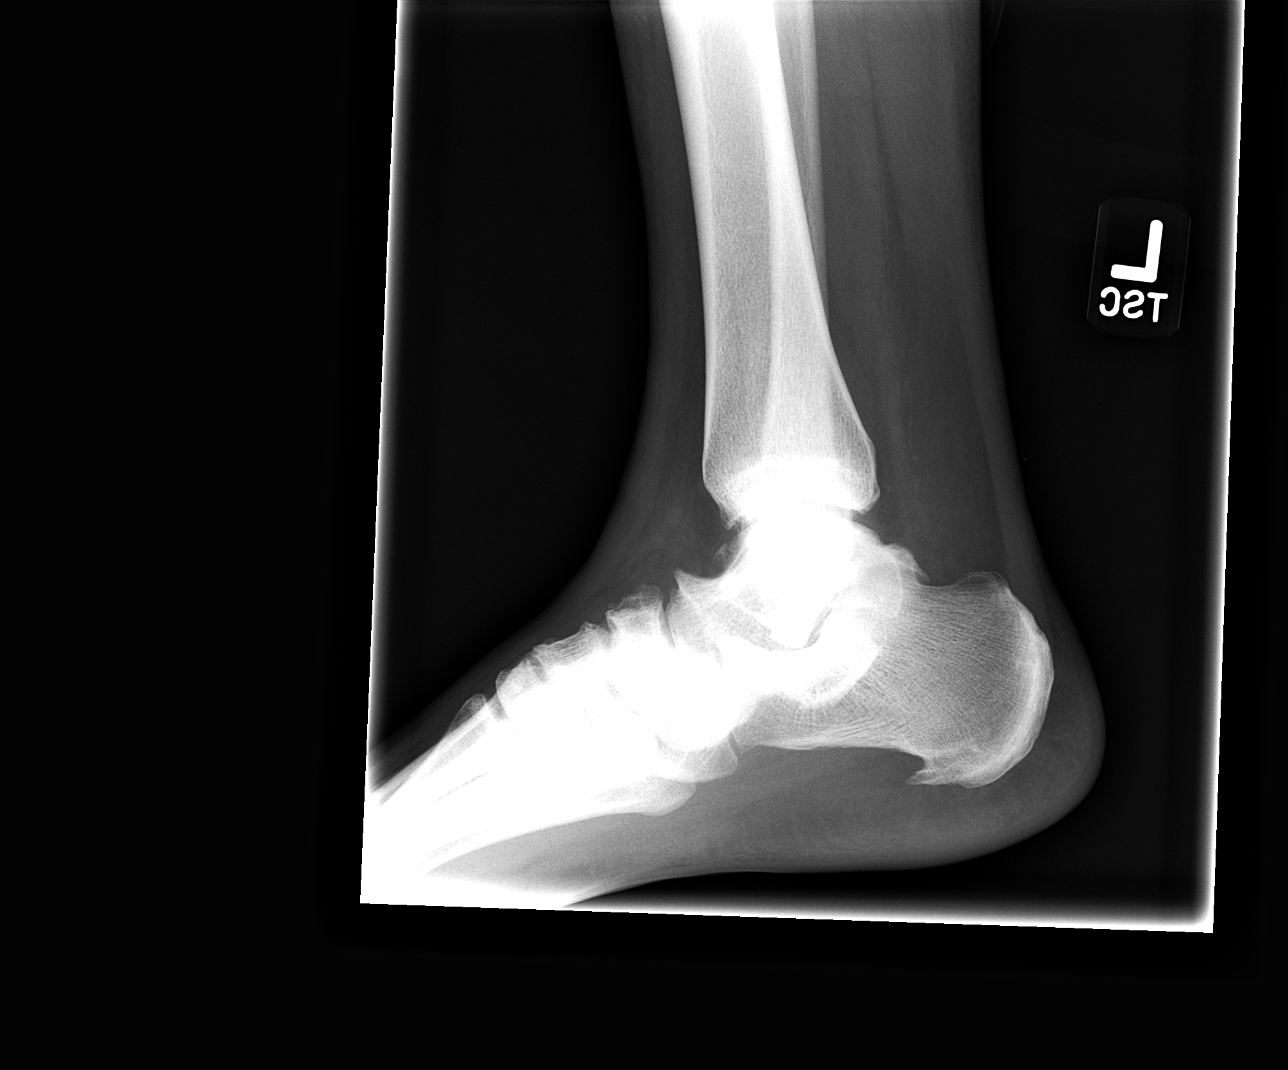

[3 of 3 positions shown; findings below may reference images not displayed]

FINDINGS: The ankle joint mortise is preserved. The talar dome is intact.
There is a tiny spur arising from the tip of the medial malleolus.
There is minimal spurring associated with the anterior aspect of the
talar dome. There is a moderate-sized calcaneal spur. The soft
tissues are unremarkable.
IMPRESSION: There are mild osteoarthritic changes of the left ankle. There is no
acute bony abnormality.

## 2017-12-18 ENCOUNTER — Encounter: Payer: Self-pay | Admitting: Family Medicine

## 2017-12-18 ENCOUNTER — Ambulatory Visit (INDEPENDENT_AMBULATORY_CARE_PROVIDER_SITE_OTHER): Payer: BC Managed Care – PPO | Admitting: Family Medicine

## 2017-12-18 VITALS — BP 124/80 | HR 60 | Ht 65.0 in | Wt 209.2 lb

## 2017-12-18 DIAGNOSIS — H6123 Impacted cerumen, bilateral: Secondary | ICD-10-CM

## 2017-12-18 DIAGNOSIS — T7840XA Allergy, unspecified, initial encounter: Secondary | ICD-10-CM | POA: Insufficient documentation

## 2017-12-18 DIAGNOSIS — J40 Bronchitis, not specified as acute or chronic: Secondary | ICD-10-CM | POA: Diagnosis not present

## 2017-12-18 MED ORDER — PREDNISONE 10 MG PO TABS: 10.0000 mg | ORAL_TABLET | Freq: Two times a day (BID) | ORAL | 0 refills | Status: AC

## 2017-12-18 MED ORDER — AZITHROMYCIN 250 MG PO TABS: ORAL_TABLET | ORAL | 0 refills | Status: DC

## 2017-12-18 MED ORDER — EAR WAX CLEANSING 6.5 % OT KIT: PACK | OTIC | 2 refills | Status: DC

## 2017-12-18 NOTE — Patient Instructions (Addendum)
Upper Respiratory Infection, Adult Most upper respiratory infections (URIs) are caused by a virus. A URI affects the nose, throat, and upper air passages. The most common type of URI is often called "the common cold." Follow these instructions at home:  Take medicines only as told by your doctor.  Gargle warm saltwater or take cough drops to comfort your throat as told by your doctor.  Use a warm mist humidifier or inhale steam from a shower to increase air moisture. This may make it easier to breathe.  Drink enough fluid to keep your pee (urine) clear or pale yellow.  Eat soups and other clear broths.  Have a healthy diet.  Rest as needed.  Go back to work when your fever is gone or your doctor says it is okay. ? You may need to stay home longer to avoid giving your URI to others. ? You can also wear a face mask and wash your hands often to prevent spread of the virus.  Use your inhaler more if you have asthma.  Do not use any tobacco products, including cigarettes, chewing tobacco, or electronic cigarettes. If you need help quitting, ask your doctor. Contact a doctor if:  You are getting worse, not better.  Your symptoms are not helped by medicine.  You have chills.  You are getting more short of breath.  You have brown or red mucus.  You have yellow or brown discharge from your nose.  You have pain in your face, especially when you bend forward.  You have a fever.  You have puffy (swollen) neck glands.  You have pain while swallowing.  You have white areas in the back of your throat. Get help right away if:  You have very bad or constant: ? Headache. ? Ear pain. ? Pain in your forehead, behind your eyes, and over your cheekbones (sinus pain). ? Chest pain.  You have long-lasting (chronic) lung disease and any of the following: ? Wheezing. ? Long-lasting cough. ? Coughing up blood. ? A change in your usual mucus.  You have a stiff neck.  You have  changes in your: ? Vision. ? Hearing. ? Thinking. ? Mood. This information is not intended to replace advice given to you by your health care provider. Make sure you discuss any questions you have with your health care provider. Document Released: 04/09/2008 Document Revised: 06/24/2016 Document Reviewed: 01/27/2014 Elsevier Interactive Patient Education  2018 [employer]  Earwax Buildup, Adult The ears produce a substance called earwax that helps keep bacteria out of the ear and protects the skin in the ear canal. Occasionally, earwax can build up in the ear and cause discomfort or hearing loss. What increases the risk? This condition is more likely to develop in people who:  Are female.  Are elderly.  Naturally produce more earwax.  Clean their ears often with cotton swabs.  Use earplugs often.  Use in-ear headphones often.  Wear hearing aids.  Have narrow ear canals.  Have earwax that is overly thick or sticky.  Have eczema.  Are dehydrated.  Have excess hair in the ear canal.  What are the signs or symptoms? Symptoms of this condition include:  Reduced or muffled hearing.  A feeling of fullness in the ear or feeling that the ear is plugged.  Fluid coming from the ear.  Ear pain.  Ear itch.  Ringing in the ear.  Coughing.  An obvious piece of earwax that can be seen inside the ear canal.  How is this diagnosed? This condition may be diagnosed based on:  Your symptoms.  Your medical history.  An ear exam. During the exam, your health care provider will look into your ear with an instrument called an otoscope.  You may have tests, including a hearing test. How is this treated? This condition may be treated by:  Using ear drops to soften the earwax.  Having the earwax removed by a health care provider. The health care provider may: ? Flush the ear with water. ? Use an instrument that has a loop on the end (curette). ? Use a suction  device.  Surgery to remove the wax buildup. This may be done in severe cases.  Follow these instructions at home:  Take over-the-counter and prescription medicines only as told by your health care provider.  Do not put any objects, including cotton swabs, into your ear. You can clean the opening of your ear canal with a washcloth or facial tissue.  Follow instructions from your health care provider about cleaning your ears. Do not over-clean your ears.  Drink enough fluid to keep your urine clear or pale yellow. This will help to thin the earwax.  Keep all follow-up visits as told by your health care provider. If earwax builds up in your ears often or if you use hearing aids, consider seeing your health care provider for routine, preventive ear cleanings. Ask your health care provider how often you should schedule your cleanings.  If you have hearing aids, clean them according to instructions from the manufacturer and your health care provider. Contact a health care provider if:  You have ear pain.  You develop a fever.  You have blood, pus, or other fluid coming from your ear.  You have hearing loss.  You have ringing in your ears that does not go away.  Your symptoms do not improve with treatment.  You feel like the room is spinning (vertigo). Summary  Earwax can build up in the ear and cause discomfort or hearing loss.  The most common symptoms of this condition include reduced or muffled hearing and a feeling of fullness in the ear or feeling that the ear is plugged.  This condition may be diagnosed based on your symptoms, your medical history, and an ear exam.  This condition may be treated by using ear drops to soften the earwax or by having the earwax removed by a health care provider.  Do not put any objects, including cotton swabs, into your ear. You can clean the opening of your ear canal with a washcloth or facial tissue. This information is not intended to  replace advice given to you by your health care provider. Make sure you discuss any questions you have with your health care provider. Document Released: 11/29/2004 Document Revised: 01/02/2017 Document Reviewed: 01/02/2017 Elsevier Interactive Patient Education  [employer].

## 2017-12-18 NOTE — Progress Notes (Signed)
Subjective:  Patient ID: Shirley Thornton, female    DOB: Dec 16, 1950  Age: 67 y.o. MRN: [id number]  CC: Establish Care   HPI Shirley Thornton presents for 7-day history of cough that is now productive of yellow phlegm.  She has had some postnasal drip but denies fevers chills, headache facial pressure or teeth pain.  She feels as though she may be wheezing a little bit at night.  She has been taking Mucinex with Tylenol.  She thinks that she may have some wax in her ears.  She has no asthma history and has no tobacco smoke exposure history.  She also developed rash on her abdomen after using gain laundry detergent just this past week.  History Shirley Thornton has a past medical history of Arthritis and Gout.   She has no past surgical history on file.   Her family history includes Arthritis in her mother; Heart disease in her brother; Hypertension in her sister; Kidney disease in her mother; Lung cancer in her brother and father; Stroke in her sister.She reports that  has never smoked. she has never used smokeless tobacco. She reports that she does not drink alcohol or use drugs.  Outpatient Medications Prior to Visit  Medication Sig Dispense Refill  . indomethacin (INDOCIN) 25 MG capsule Take 1-2 capsules (25-50 mg total) by mouth 3 (three) times daily with meals. Every 6 to 8 hours with food 90 capsule 0   No facility-administered medications prior to visit.     ROS Review of Systems  Constitutional: Negative for chills, fatigue and fever.  HENT: Positive for postnasal drip. Negative for congestion, rhinorrhea, sinus pressure, sinus pain and sore throat.   Eyes: Negative for photophobia and visual disturbance.  Respiratory: Positive for cough and wheezing. Negative for chest tightness and shortness of breath.   Cardiovascular: Negative for chest pain, palpitations and leg swelling.  Gastrointestinal: Negative.   Genitourinary: Negative.   Musculoskeletal: Negative for arthralgias and  myalgias.  Skin: Positive for color change and rash.  Neurological: Negative for weakness and headaches.  Hematological: Negative.   Psychiatric/Behavioral: Negative.     Objective:  BP 124/80 (BP Location: Right Arm, Patient Position: Sitting, Cuff Size: Normal)   Pulse 60   Ht 5' 5"  (1.651 m)   Wt 209 lb 4 oz (94.9 kg)   SpO2 97%   BMI 34.82 kg/m   Physical Exam  Constitutional: She is oriented to person, place, and time. She appears well-developed and well-nourished. No distress.  HENT:  Head: Normocephalic and atraumatic.  Right Ear: External ear normal.  Left Ear: External ear normal.  Mouth/Throat: Oropharynx is clear and moist. No oropharyngeal exudate.  Eyes: Conjunctivae are normal. Pupils are equal, round, and reactive to light. Right eye exhibits no discharge. Left eye exhibits no discharge. No scleral icterus.  Neck: Neck supple. No JVD present. No tracheal deviation present. No thyromegaly present.  Cardiovascular: Normal rate, regular rhythm and normal heart sounds.  Pulmonary/Chest: Effort normal and breath sounds normal. No stridor. No respiratory distress. She has no wheezes. She has no rales.  Abdominal: Bowel sounds are normal.  Lymphadenopathy:    She has no cervical adenopathy.  Neurological: She is alert and oriented to person, place, and time.  Skin: Skin is warm and dry. She is not diaphoretic.  On the left and right abdominal flanks are zones of erythema and hyperpigmentation. Similar zone noted on lower abdomen. No rash under breasts.   Psychiatric: She has a normal mood and affect. Her  behavior is normal.      Assessment & Plan:   Shirley Thornton was seen today for establish care.  Diagnoses and all orders for this visit:  Ceruminosis, bilateral -     Carbamide Peroxide-Saline (EAR WAX CLEANSING) 6.5 % KIT; Follow directions on kit.  Bronchitis -     azithromycin (ZITHROMAX) 250 MG tablet; Take 2 today and then one each day until finished. -      predniSONE (DELTASONE) 10 MG tablet; Take 1 tablet (10 mg total) by mouth 2 (two) times daily with a meal for 7 days.  Allergic reaction, initial encounter -     predniSONE (DELTASONE) 10 MG tablet; Take 1 tablet (10 mg total) by mouth 2 (two) times daily with a meal for 7 days.   I have discontinued Shirley Thornton's indomethacin. I am also having her start on azithromycin, predniSONE, and EAR WAX CLEANSING.  Meds ordered this encounter  Medications  . azithromycin (ZITHROMAX) 250 MG tablet    Sig: Take 2 today and then one each day until finished.    Dispense:  6 tablet    Refill:  0  . predniSONE (DELTASONE) 10 MG tablet    Sig: Take 1 tablet (10 mg total) by mouth 2 (two) times daily with a meal for 7 days.    Dispense:  14 tablet    Refill:  0  . Carbamide Peroxide-Saline (EAR WAX CLEANSING) 6.5 % KIT    Sig: Follow directions on kit.    Dispense:  1 kit    Refill:  2     Follow-up: Return in about 1 week (around 12/25/2017), or if symptoms worsen or fail to improve.  Libby Maw, MD

## 2018-02-23 ENCOUNTER — Emergency Department (HOSPITAL_BASED_OUTPATIENT_CLINIC_OR_DEPARTMENT_OTHER)
Admission: EM | Admit: 2018-02-23 | Discharge: 2018-02-23 | Disposition: A | Discharge status: Home / Self Care | Payer: BC Managed Care – PPO | Attending: Emergency Medicine | Admitting: Emergency Medicine

## 2018-02-23 ENCOUNTER — Other Ambulatory Visit: Payer: Self-pay

## 2018-02-23 ENCOUNTER — Encounter (HOSPITAL_BASED_OUTPATIENT_CLINIC_OR_DEPARTMENT_OTHER): Payer: Self-pay | Admitting: Emergency Medicine

## 2018-02-23 ENCOUNTER — Emergency Department (HOSPITAL_BASED_OUTPATIENT_CLINIC_OR_DEPARTMENT_OTHER): Payer: BC Managed Care – PPO

## 2018-02-23 DIAGNOSIS — M25552 Pain in left hip: Secondary | ICD-10-CM | POA: Diagnosis present

## 2018-02-23 MED ORDER — NAPROXEN 375 MG PO TABS: 375.0000 mg | ORAL_TABLET | Freq: Two times a day (BID) | ORAL | 0 refills | Status: DC

## 2018-02-23 MED ORDER — ACETAMINOPHEN 500 MG PO TABS
1000.0000 mg | ORAL_TABLET | Freq: Once | ORAL | Status: AC
  Administered 2018-02-23: 1000 mg via ORAL
  Filled 2018-02-23: qty 2

## 2018-02-23 MED ORDER — KETOROLAC TROMETHAMINE 30 MG/ML IJ SOLN
30.0000 mg | Freq: Once | INTRAMUSCULAR | Status: AC
  Administered 2018-02-23: 30 mg via INTRAMUSCULAR
  Filled 2018-02-23: qty 1

## 2018-02-23 MED ORDER — ACETAMINOPHEN 500 MG PO TABS: 1000.0000 mg | ORAL_TABLET | Freq: Three times a day (TID) | ORAL | 0 refills | Status: DC | PRN

## 2018-02-23 NOTE — ED Triage Notes (Signed)
L hip pain since yesterday. Pt states she was lifting several flower pots and may have caused pain.

## 2018-02-23 NOTE — ED Provider Notes (Signed)
[facility] EMERGENCY DEPARTMENT Provider Note   CSN: [id number] Arrival date & time: 02/23/18  0754     History   Chief Complaint Chief Complaint  Patient presents with  . Hip Pain    HPI Shirley Thornton is a 67 y.o. female.  67yo F with history of gout who presents with left hip pain.  Patient began having left hip pain yesterday which has been persistent through today, worse with walking.  She has been ambulatory but it makes her pain worse.  She has not taken any medications for her symptoms.  She denies any recent trauma or falls.  No fevers or recent illness.  No back pain, extremity weakness, numbness, or bowel/bladder incontinence.  She does report that she was lifting some plants recently and wonders whether she overexerted herself.  She also wonders whether the pain may be related to gout.  The history is provided by the patient.  Hip Pain     Past Medical History:  Diagnosis Date  . Arthritis   . Gout     Patient Active Problem List   Diagnosis Date Noted  . Ceruminosis, bilateral 12/18/2017  . Bronchitis 12/18/2017  . Allergic reaction 12/18/2017    History reviewed. No pertinent surgical history.   OB History   None      Home Medications    Prior to Admission medications   Medication Sig Start Date End Date Taking? Authorizing Provider  acetaminophen (TYLENOL) 500 MG tablet Take 2 tablets (1,000 mg total) by mouth every 8 (eight) hours as needed. 02/23/18   Linnae Rasool, Wenda Overland, MD  azithromycin (ZITHROMAX) 250 MG tablet Take 2 today and then one each day until finished. 12/18/17   Libby Maw, MD  Carbamide Peroxide-Saline (EAR WAX CLEANSING) 6.5 % KIT Follow directions on kit. 12/18/17   Libby Maw, MD  naproxen (NAPROSYN) 375 MG tablet Take 1 tablet (375 mg total) by mouth 2 (two) times daily for 4 days. 02/23/18 02/27/18  Sheza Strickland, Wenda Overland, MD    Family History Family History  Problem Relation Age of Onset    . Arthritis Mother   . Kidney disease Mother   . Lung cancer Father   . Stroke Sister   . Hypertension Sister   . Lung cancer Brother   . Heart disease Brother     Social History Social History   Tobacco Use  . Smoking status: Never Smoker  . Smokeless tobacco: Never Used  Substance Use Topics  . Alcohol use: No  . Drug use: No     Allergies   Other   Review of Systems Review of Systems All other systems reviewed and are negative except that which was mentioned in HPI   Physical Exam Updated Vital Signs BP (!) 156/75 (BP Location: Left Arm)   Pulse 76   Temp 97.9 F (36.6 C) (Oral)   Resp 18   Ht 5' 7"  (1.702 m)   Wt 117.9 kg (260 lb)   SpO2 100%   BMI 40.72 kg/m   Physical Exam  Constitutional: She is oriented to person, place, and time. She appears well-developed and well-nourished. No distress.  HENT:  Head: Normocephalic and atraumatic.  Moist mucous membranes  Eyes: Conjunctivae are normal.  Neck: Neck supple.  Cardiovascular: Normal rate, regular rhythm, normal heart sounds and intact distal pulses.  No murmur heard. Pulmonary/Chest: Effort normal and breath sounds normal.  Abdominal: Soft. Bowel sounds are normal. She exhibits no distension. There is no tenderness.  Musculoskeletal: She exhibits no edema or deformity.  Normal ROM L hip, tenderness w/ palpation of iliac crest; pain with external rotation and axial loading of hip  Neurological: She is alert and oriented to person, place, and time. She displays normal reflexes. No sensory deficit.  Fluent speech 5/5 strength BLE  Skin: Skin is warm and dry.  Psychiatric: She has a normal mood and affect. Judgment normal.  Nursing note and vitals reviewed.    ED Treatments / Results  Labs (all labs ordered are listed, but only abnormal results are displayed) Labs Reviewed - No data to display  EKG None  Radiology Dg Hip Unilat With Pelvis 2-3 Views Left  Result Date: 02/23/2018 CLINICAL  DATA:  67 year old female with left hip pain. No known fall or injury. Patient was lifting flower pot yesterday and has a history of gout. EXAM: DG HIP (WITH OR WITHOUT PELVIS) 2-3V LEFT COMPARISON:  None. FINDINGS: There is no evidence of hip fracture or dislocation. There is no evidence of arthropathy or other focal bone abnormality. Osteitis pubis noted incidentally. IMPRESSION: Negative. Electronically Signed   By: Jacqulynn Cadet M.D.   On: 02/23/2018 09:50    Procedures Procedures (including critical care time)  Medications Ordered in ED Medications  acetaminophen (TYLENOL) tablet 1,000 mg (1,000 mg Oral Given 02/23/18 0838)  ketorolac (TORADOL) 30 MG/ML injection 30 mg (30 mg Intramuscular Given 02/23/18 0957)     Initial Impression / Assessment and Plan / ED Course  I have reviewed the triage vital signs and the nursing notes.  Pertinent labs & imaging results that were available during my care of the patient were reviewed by me and considered in my medical decision making (see chart for details).     Full ROM of hip on exam w/o obvious deformity. No infectious sx to suggest septic joint. XR negative. Gave above medications w/ improvement in pain. Gout flare or osteoarthritis are possible etiologies of her pain. She has been ambulatory thus I do not feel she needs any further imaging at this time but I have discussed follow-up and supportive measures at home.  Extensively reviewed return precautions including inability to ambulate or fever.  Patient voiced understanding.  Final Clinical Impressions(s) / ED Diagnoses   Final diagnoses:  Left hip pain    ED Discharge Orders        Ordered    naproxen (NAPROSYN) 375 MG tablet  2 times daily     02/23/18 1125    acetaminophen (TYLENOL) 500 MG tablet  Every 8 hours PRN     02/23/18 1125       [place]aclara Acklin, Wenda Overland, MD 02/23/18 [phone]

## 2018-02-24 ENCOUNTER — Emergency Department (HOSPITAL_COMMUNITY)
Admission: EM | Admit: 2018-02-24 | Discharge: 2018-02-24 | Disposition: A | Discharge status: Home / Self Care | Payer: BC Managed Care – PPO | Attending: Emergency Medicine | Admitting: Emergency Medicine

## 2018-02-24 ENCOUNTER — Encounter (HOSPITAL_COMMUNITY): Payer: Self-pay | Admitting: Emergency Medicine

## 2018-02-24 ENCOUNTER — Emergency Department (HOSPITAL_COMMUNITY): Payer: BC Managed Care – PPO

## 2018-02-24 DIAGNOSIS — M25552 Pain in left hip: Secondary | ICD-10-CM | POA: Diagnosis present

## 2018-02-24 DIAGNOSIS — Z79899 Other long term (current) drug therapy: Secondary | ICD-10-CM | POA: Diagnosis not present

## 2018-02-24 LAB — CBC WITH DIFFERENTIAL/PLATELET
Basophils Absolute: 0 10*3/uL (ref 0.0–0.1)
Basophils Relative: 0 %
EOS ABS: 0.1 10*3/uL (ref 0.0–0.7)
Eosinophils Relative: 3 %
HCT: 36.9 % (ref 36.0–46.0)
HEMOGLOBIN: 12.4 g/dL (ref 12.0–15.0)
LYMPHS PCT: 24 %
Lymphs Abs: 1.2 10*3/uL (ref 0.7–4.0)
MCH: 30.2 pg (ref 26.0–34.0)
MCHC: 33.6 g/dL (ref 30.0–36.0)
MCV: 90 fL (ref 78.0–100.0)
Monocytes Absolute: 0.3 10*3/uL (ref 0.1–1.0)
Monocytes Relative: 5 %
NEUTROS ABS: 3.3 10*3/uL (ref 1.7–7.7)
NEUTROS PCT: 68 %
Platelets: 202 10*3/uL (ref 150–400)
RBC: 4.1 MIL/uL (ref 3.87–5.11)
RDW: 13.7 % (ref 11.5–15.5)
WBC: 4.9 10*3/uL (ref 4.0–10.5)

## 2018-02-24 LAB — URINALYSIS, ROUTINE W REFLEX MICROSCOPIC
BILIRUBIN URINE: NEGATIVE
GLUCOSE, UA: NEGATIVE mg/dL
HGB URINE DIPSTICK: NEGATIVE
KETONES UR: NEGATIVE mg/dL
NITRITE: NEGATIVE
PROTEIN: NEGATIVE mg/dL
Specific Gravity, Urine: 1.019 (ref 1.005–1.030)
pH: 5 (ref 5.0–8.0)

## 2018-02-24 LAB — BASIC METABOLIC PANEL
Anion gap: 10 (ref 5–15)
BUN: 12 mg/dL (ref 6–20)
CHLORIDE: 105 mmol/L (ref 101–111)
CO2: 21 mmol/L — ABNORMAL LOW (ref 22–32)
Calcium: 9 mg/dL (ref 8.9–10.3)
Creatinine, Ser: 0.99 mg/dL (ref 0.44–1.00)
GFR calc Af Amer: >60 mL/min (ref >60)
GFR calc non Af Amer: 59 mL/min — ABNORMAL LOW (ref >60)
Glucose, Bld: 97 mg/dL (ref 65–99)
POTASSIUM: 3.4 mmol/L — AB (ref 3.5–5.1)
SODIUM: 136 mmol/L (ref 135–145)

## 2018-02-24 MED ORDER — IBUPROFEN 800 MG PO TABS
800.0000 mg | ORAL_TABLET | Freq: Once | ORAL | Status: AC
  Administered 2018-02-24: 800 mg via ORAL
  Filled 2018-02-24: qty 1

## 2018-02-24 MED ORDER — IOPAMIDOL (ISOVUE-300) INJECTION 61%
INTRAVENOUS | Status: AC
  Filled 2018-02-24: qty 100

## 2018-02-24 MED ORDER — OXYCODONE HCL 5 MG PO TABS: 5.0000 mg | ORAL_TABLET | Freq: Once | ORAL | Status: DC

## 2018-02-24 MED ORDER — IOPAMIDOL (ISOVUE-300) INJECTION 61%
100.0000 mL | Freq: Once | INTRAVENOUS | Status: AC | PRN
  Administered 2018-02-24: 100 mL via INTRAVENOUS

## 2018-02-24 MED ORDER — ONDANSETRON 4 MG PO TBDP
4.0000 mg | ORAL_TABLET | Freq: Once | ORAL | Status: AC
  Administered 2018-02-24: 4 mg via ORAL
  Filled 2018-02-24: qty 1

## 2018-02-24 MED ORDER — FENTANYL CITRATE (PF) 100 MCG/2ML IJ SOLN
50.0000 ug | Freq: Once | INTRAMUSCULAR | Status: AC
  Administered 2018-02-24: 50 ug via INTRAVENOUS
  Filled 2018-02-24: qty 2

## 2018-02-24 MED ORDER — POTASSIUM CHLORIDE CRYS ER 20 MEQ PO TBCR
40.0000 meq | EXTENDED_RELEASE_TABLET | Freq: Once | ORAL | Status: AC
Start: 2018-02-24 — End: 2018-02-24
  Administered 2018-02-24: 40 meq via ORAL
  Filled 2018-02-24: qty 2

## 2018-02-24 MED ORDER — MELOXICAM 7.5 MG PO TABS: 7.5000 mg | ORAL_TABLET | Freq: Every day | ORAL | 0 refills | Status: DC

## 2018-02-24 NOTE — ED Provider Notes (Signed)
I assumed patient care at shift change [facility] Mut[facility] PA-C.  Briefly patient is a 67 year old woman here for evaluation of hip and back pain.  She was seen for this recently and diagnosed with gout.  Patient tells me that she does have a history of gout and that it was in her left ankle.  Patient reports that her pain started after she picked up a very heavy flower pot, started in her hip however then progressed to her back.  Suspect that back pain is musculoskeletal in nature, as it is easily recreatable with palpation of left-sided paraspinal muscles, suspect related to mild antalgic/limping gait from hip pain.    Plan is to follow-up on CT scan.  CT scan completed does not show any evidence of hip joint effusion.  Patient is afebrile, able to ambulate with slight limp, no elevations in WBC, not tachycardic, do not suspect septic arthritis.  UA obtained, moderate leukocytes without blood, protein, or nitrites, mucus present, sent for culture.    Results were discussed with patient, along with return precautions.  She was instructed not to drive or operate heavy machinery for the next 24 hours as she got a dose of fentanyl.  Patient was given these instructions both verbally and written on her discharge summary.  Patient does not have any history of GI bleeding or kidney disease, request something stronger than naproxen, will give her short course of Mobic.  She was instructed to follow-up with orthopedics and her PCP.  Patient discharged home.  Ct Abdomen Pelvis W Contrast  Result Date: 02/24/2018 CLINICAL DATA:  Left lower quadrant pain.  Left pelvic and hip pain EXAM: CT ABDOMEN AND PELVIS WITH CONTRAST TECHNIQUE: Multidetector CT imaging of the abdomen and pelvis was performed using the standard protocol following bolus administration of intravenous contrast. CONTRAST:  100mL ISOVUE-300 IOPAMIDOL (ISOVUE-300) INJECTION 61% COMPARISON:  None. FINDINGS: Lower chest: Lung bases clear.  No  infiltrate or effusion Hepatobiliary: Normal liver.  Normal gallbladder and bile ducts. Pancreas: Negative Spleen: Negative Adrenals/Urinary Tract: Negative for renal obstruction. No urinary tract calculi. Negative for mass lesion. Urinary bladder normal. Stomach/Bowel: Normal stomach. Negative for bowel obstruction. No bowel mass or edema. Negative for diverticulitis. Vascular/Lymphatic: Negative Reproductive: Hysterectomy.  No pelvic mass lesion. Other: Negative for free fluid Musculoskeletal: Disc degeneration and facet degeneration lower lumbar spine. No acute skeletal abnormality. IMPRESSION: No cause for acute left lower quadrant pain identified. Lumbar spine degenerative change and spurring Electronically Signed   By: Marlan Palauharles  Clark M.D.   On: 02/24/2018 07:58   Dg Hip Unilat With Pelvis 2-3 Views Left  Result Date: 02/23/2018 CLINICAL DATA:  67 year old female with left hip pain. No known fall or injury. Patient was lifting flower pot yesterday and has a history of gout. EXAM: DG HIP (WITH OR WITHOUT PELVIS) 2-3V LEFT COMPARISON:  None. FINDINGS: There is no evidence of hip fracture or dislocation. There is no evidence of arthropathy or other focal bone abnormality. Osteitis pubis noted incidentally. IMPRESSION: Negative. Electronically Signed   By: Malachy MoanHeath  McCullough M.D.   On: 02/23/2018 09:50      Cristina GongHammond, Elizabeth W, PA-C 02/24/18 [id number]    Ward, Layla MawKristen N, DO 02/24/18 1323

## 2018-02-24 NOTE — ED Provider Notes (Signed)
[facility] EMERGENCY DEPARTMENT Provider Note   CSN: [id number] Arrival date & time: 02/24/18  0103     History   Chief Complaint Chief Complaint  Patient presents with  . Hip Pain    HPI Shirley Thornton is a 67 y.o. female with a hx of rice, gout presents to the Emergency Department complaining of gradual, persistent, progressively worsening to pain 3 days ago.  Patient reports the pain became so severe yesterday that she presented to [facility].  She had an x-ray taken there and was diagnosed with gout.  She was discharged home with Tylenol and naproxen.  She reports taking these medications without any relief.  She reports that tonight she comes in because her pain has worsened to the point that she is having difficulty walking and is unable to go to sleep.  She reports movement and palpation make her pain significantly worse.  Nothing seems to make it better.  Patient denies a history of pelvic surgeries or procedures, fevers, chills, rash, falls, known injury, weakness, numbness, tingling.   The history is provided by the patient and medical records. No language interpreter was used.    Past Medical History:  Diagnosis Date  . Arthritis   . Gout     Patient Active Problem List   Diagnosis Date Noted  . Ceruminosis, bilateral 12/18/2017  . Bronchitis 12/18/2017  . Allergic reaction 12/18/2017    History reviewed. No pertinent surgical history.   OB History   None      Home Medications    Prior to Admission medications   Medication Sig Start Date End Date Taking? Authorizing Provider  acetaminophen (TYLENOL) 500 MG tablet Take 2 tablets (1,000 mg total) by mouth every 8 (eight) hours as needed. 02/23/18  Yes Little, Wenda Overland, MD  naproxen (NAPROSYN) 375 MG tablet Take 1 tablet (375 mg total) by mouth 2 (two) times daily for 4 days. 02/23/18 02/27/18 Yes Little, Wenda Overland, MD  azithromycin (ZITHROMAX) 250 MG tablet Take 2 today  and then one each day until finished. Patient not taking: Reported on 02/24/2018 12/18/17   Libby Maw, MD  Carbamide Peroxide-Saline (EAR WAX CLEANSING) 6.5 % KIT Follow directions on kit. Patient not taking: Reported on 02/24/2018 12/18/17   Libby Maw, MD    Family History Family History  Problem Relation Age of Onset  . Arthritis Mother   . Kidney disease Mother   . Lung cancer Father   . Stroke Sister   . Hypertension Sister   . Lung cancer Brother   . Heart disease Brother     Social History Social History   Tobacco Use  . Smoking status: Never Smoker  . Smokeless tobacco: Never Used  Substance Use Topics  . Alcohol use: No  . Drug use: No     Allergies   Other   Review of Systems Review of Systems  Constitutional: Negative for appetite change, diaphoresis, fatigue, fever and unexpected weight change.  HENT: Negative for mouth sores.   Eyes: Negative for visual disturbance.  Respiratory: Negative for cough, chest tightness, shortness of breath and wheezing.   Cardiovascular: Negative for chest pain.  Gastrointestinal: Positive for abdominal pain (LLQ). Negative for constipation, diarrhea, nausea and vomiting.  Endocrine: Negative for polydipsia, polyphagia and polyuria.  Genitourinary: Negative for dysuria, frequency, hematuria and urgency.  Musculoskeletal: Positive for arthralgias ( Left hip) and gait problem. Negative for back pain, joint swelling and neck stiffness.  Skin: Negative  for rash.  Allergic/Immunologic: Negative for immunocompromised state.  Neurological: Negative for syncope, light-headedness and headaches.  Hematological: Does not bruise/bleed easily.  Psychiatric/Behavioral: Negative for sleep disturbance. The patient is not nervous/anxious.      Physical Exam Updated Vital Signs BP (!) 148/73 (BP Location: Left Arm)   Pulse 60   Temp 97.8 F (36.6 C) (Oral)   Resp 18   Ht _0  (1.702 m)   Wt 127 kg (280 lb)    SpO2 100%   BMI 43.85 kg/m   Physical Exam  Constitutional: She appears well-developed and well-nourished. No distress.  Awake, alert, nontoxic appearance Patient appears very uncomfortable.  HENT:  Head: Normocephalic and atraumatic.  Mouth/Throat: Oropharynx is clear and moist. No oropharyngeal exudate.  Eyes: Conjunctivae are normal. No scleral icterus.  Neck: Normal range of motion. Neck supple.  Cardiovascular: Normal rate, regular rhythm and intact distal pulses.  Pulmonary/Chest: Effort normal and breath sounds normal. No respiratory distress. She has no wheezes.  Equal chest expansion  Abdominal: Soft. Bowel sounds are normal. She exhibits no mass. There is tenderness in the left lower quadrant. There is no rigidity, no rebound, no guarding and no CVA tenderness.  Musculoskeletal: She exhibits no edema.       Left hip: She exhibits decreased range of motion, decreased strength and tenderness ( anterior). She exhibits no crepitus, no deformity and no laceration.  Significant pain with ROM of the left hip. Pain radiates into the LLQ of the abd.    Neurological: She is alert.  Speech is clear and goal oriented Moves extremities without ataxia Sensation intact to normal touch Strength 5/5 with dorsiflexion and plantarflexion Antalgic gait.  Pt is able to walk and bear weight.  Skin: Skin is warm and dry. She is not diaphoretic.  Psychiatric: She has a normal mood and affect.  Nursing note and vitals reviewed.    ED Treatments / Results  Labs (all labs ordered are listed, but only abnormal results are displayed) Labs Reviewed  BASIC METABOLIC PANEL - Abnormal; Notable for the following components:      Result Value   Potassium 3.4 (*)    CO2 21 (*)    GFR calc non Af Amer 59 (*)    All other components within normal limits  CBC WITH DIFFERENTIAL/PLATELET  URINALYSIS, ROUTINE W REFLEX MICROSCOPIC    Radiology Dg Hip Unilat With Pelvis 2-3 Views Left  Result Date:  02/23/2018 CLINICAL DATA:  67 year old female with left hip pain. No known fall or injury. Patient was lifting flower pot yesterday and has a history of gout. EXAM: DG HIP (WITH OR WITHOUT PELVIS) 2-3V LEFT COMPARISON:  None. FINDINGS: There is no evidence of hip fracture or dislocation. There is no evidence of arthropathy or other focal bone abnormality. Osteitis pubis noted incidentally. IMPRESSION: Negative. Electronically Signed   By: Jacqulynn Cadet M.D.   On: 02/23/2018 09:50    Procedures Procedures (including critical care time)  Medications Ordered in ED Medications  oxyCODONE (Oxy IR/ROXICODONE) immediate release tablet 5 mg (5 mg Oral Not Given 02/24/18 0547)  ondansetron (ZOFRAN-ODT) disintegrating tablet 4 mg (4 mg Oral Not Given 02/24/18 0547)  iopamidol (ISOVUE-300) 61 % injection (has no administration in time range)  fentaNYL (SUBLIMAZE) injection 50 mcg (has no administration in time range)  potassium chloride SA (K-DUR,KLOR-CON) CR tablet 40 mEq (has no administration in time range)     Initial Impression / Assessment and Plan / ED Course  I have reviewed  the triage vital signs and the nursing notes.  Pertinent labs & imaging results that were available during my care of the patient were reviewed by me and considered in my medical decision making (see chart for details).  Clinical Course as of Feb 25 620  Mon Feb 24, 2018  0158 [place] narcotic database accessed.  No record of patient in the database at all   [HM]  0252 Record review shows that patient was seen at [facility] on 02/23/2018.  X-ray of the left hip showed osteitis pubis, but no evidence of acute fracture or joint effusion.  I personally evaluated these images.    [HM]  0533 Pt continues to have pain stating that her pain now radiates into her left low back.  Will give IV pain control   [HM]  0533 Noted.  Oral replacement given  Potassium(!): 3.4 [HM]    Clinical Course User Index [HM]  Opal Dinning, Jarrett Soho, PA-C    Patient with worsening left hip, left lower quadrant and left low back pain over the last 24 hours.  This is her second visit to the emergency department at 24 hours.  Labs are reassuring.  She is afebrile and is able to walk however range of motion of her hip creates severe pain in her lower abdomen and hip.  CT scan of her abdomen and pelvis are pending.  Plain films taken at previous visit showed no acute abnormalities.  I personally evaluated these images.  At shift change care was transferred to Wyn Quaker, PA-C who will follow pending studies, re-evaulate and determine disposition.     Final Clinical Impressions(s) / ED Diagnoses   Final diagnoses:  Left hip pain    ED Discharge Orders    None       Mrytle Bento, Gwenlyn Perking 02/24/18 0622    Ward, Delice Bison, DO 02/24/18 [phone]

## 2018-02-24 NOTE — ED Notes (Signed)
Crackers and peanut butter given with administration of motrin

## 2018-02-24 NOTE — ED Triage Notes (Signed)
Pt c/o L hip pain, onset Friday after bending over picking up large flower pots. Pt seen at [facility], pt dx with gout, pt states pain worse despite taking tylenol and Naproxen as directed.

## 2018-02-24 NOTE — Discharge Instructions (Signed)
If you are unable to walk, develop fevers, chills, nausea, vomiting, burning when you pee, blood in your urine or have any other concerns please seek additional medical care.    I have given you a prescription for Mobic (meloxicam) today.  Mobic is a NSAID medication and you should not take it with other NSAIDs.  Examples of other NSAIDS include motrin, ibuprofen, aleve, naproxen, and Voltaren.  Please monitor your bowel movements for dark, tarry, sticky stools. If you have any bowel movements like this you need to stop taking mobic and call your doctor as this may represent a stomach ulcer from taking NSAIDS.  You may take Prilosec to help with heart burn.   Today you received medications that may make you sleepy or impair your ability to make decisions.  For the next 24 hours please do not drive, operate heavy machinery, care for a small child with out another adult present, or perform any activities that may cause harm to you or someone else if you were to fall asleep or be impaired.    Please take Tylenol (acetaminophen) to relieve your pain.  You may take tylenol, up to 1,000 mg (two extra strength pills).  Do not take more than 3,000 mg tylenol in a 24 hour period.  Please check all medication labels as many medications such as pain and cold medications may contain tylenol. Please do not drink alcohol while taking this medication.

## 2018-02-25 ENCOUNTER — Ambulatory Visit (INDEPENDENT_AMBULATORY_CARE_PROVIDER_SITE_OTHER): Payer: BC Managed Care – PPO | Admitting: Family Medicine

## 2018-02-25 ENCOUNTER — Other Ambulatory Visit: Payer: Self-pay | Admitting: Family Medicine

## 2018-02-25 ENCOUNTER — Encounter: Payer: Self-pay | Admitting: Family Medicine

## 2018-02-25 DIAGNOSIS — J40 Bronchitis, not specified as acute or chronic: Secondary | ICD-10-CM

## 2018-02-25 DIAGNOSIS — M25552 Pain in left hip: Secondary | ICD-10-CM

## 2018-02-25 LAB — URINE CULTURE

## 2018-02-25 MED ORDER — PREDNISONE 10 MG PO TABS: ORAL_TABLET | ORAL | 0 refills | Status: DC

## 2018-02-25 NOTE — Patient Instructions (Signed)
You have a severe overuse strain of your hip flexor. Try prednisone dose pack x 6 days. Stop the meloxicam. Ok to take tylenol 1-2 tabs three times a day with this. If you notice improvement you can do the hip flexion exercise I showed you. Follow up with me in about 2 weeks for reevaluation. Consider muscle relaxant, physical therapy if not improving.

## 2018-02-27 ENCOUNTER — Telehealth: Payer: Self-pay | Admitting: Family Medicine

## 2018-02-27 NOTE — Telephone Encounter (Signed)
Patient calling to see if she can get a prescription for a medical recliner

## 2018-02-28 NOTE — Telephone Encounter (Signed)
Patient was informed about lift chair. Patient states she is not having problems getting up and down from a chair, she just wanted it for comfort so she decided to hold off on physical therapy for strengthening and balancing/ Patient is experiencing the pain when she has to walk  Patient states she is still experiencing a lot of pain despite taking the prednisone as prescribed with the tylenol. Patient says the pain is causing her to stay in bed when usually she is very active.   Patient bought Biofreeze and wants to know your opinion if this will help soothe her pain

## 2018-02-28 NOTE — Telephone Encounter (Signed)
She's talking about a lift chair.  These have actually been shown to increase risk of falls so we don't prescribe them.   If someone is having a problem getting up from a chair the focus needs to be on strengthening and balance; physical therapy is an option and we could start it now before I see her for follow-up if she wants.

## 2018-02-28 NOTE — Telephone Encounter (Signed)
I think the biofreeze is a good idea along with tylenol and the prednisone.  When she's done with the prednisone if she can take anti-inflammatories she can take aleve twice a day with food.  Because her x-rays and CT looked good, I don't see any reason at this time to do additional imaging - it would be to go ahead with physical therapy if she wants.  We could also order a home health evaluation and it's possible she'd qualify for home PT.

## 2018-03-03 ENCOUNTER — Encounter: Payer: Self-pay | Admitting: Family Medicine

## 2018-03-03 DIAGNOSIS — M545 Low back pain: Secondary | ICD-10-CM | POA: Insufficient documentation

## 2018-03-03 DIAGNOSIS — M79605 Pain in left leg: Secondary | ICD-10-CM

## 2018-03-03 NOTE — Telephone Encounter (Signed)
We may want to move her follow-up up even closer than that given the amount of problems she's having despite the medication.  She can continue tylenol with the aleve.

## 2018-03-03 NOTE — Telephone Encounter (Signed)
Patient was informed. She states she started using the Biofreeze this weekend and it has helped but she is still experiencing pain when walking, standing and when laying on her left side in bed.   Patient asked if she should discontinue the tylenol when she starts aleve?   Asked about physical therapy and she would like to discuss that with you at her next visit. She moved her follow up to Thursday, 5/9

## 2018-03-03 NOTE — Assessment & Plan Note (Signed)
exam is reassuring.  Independently reviewed radiographs but no evidence of cause for her pain (not consistent with osteitis pubis based on exam).  CT includes hips and no abnormalities to account for her pain here either.  Consistent with overuse strain of hip flexor.  She will try tylenol with prednisone.  If improving add hip flexion exercise.  F/u in 2 weeks for reevaluation.  Consider muscle relaxant, physical therapy if not improving.

## 2018-03-03 NOTE — Progress Notes (Signed)
PCP: Libby Maw, MD  Subjective:   HPI: Patient is a 67 y.o. female here for low back, left hip pain.  Patient reports she started to get pain in left side of low back and left hip on 4/19. She didn't have an acute injury but recalls changing flower pots that day and did a lot of moving and squatting. Couldn't sleep on her left side that night. Pain seemed to resolve though and return on Saturday. Tried naproxen and tylenol initially then switched to meloxicam. Pain level up to 10/10 and sharp. Has history of gout. Pain worse with prolonged standing. No bowel/bladder dysfunction. No skin changes, numbness.  Past Medical History:  Diagnosis Date  . Arthritis   . Gout     Current Outpatient Medications on File Prior to Visit  Medication Sig Dispense Refill  . acetaminophen (TYLENOL) 500 MG tablet Take 2 tablets (1,000 mg total) by mouth every 8 (eight) hours as needed. 30 tablet 0  . azithromycin (ZITHROMAX) 250 MG tablet Take 2 today and then one each day until finished. (Patient not taking: Reported on 02/24/2018) 6 tablet 0  . Carbamide Peroxide-Saline (EAR WAX CLEANSING) 6.5 % KIT Follow directions on kit. (Patient not taking: Reported on 02/24/2018) 1 kit 2  . meloxicam (MOBIC) 7.5 MG tablet Take 1 tablet (7.5 mg total) by mouth daily. 10 tablet 0   No current facility-administered medications on file prior to visit.     History reviewed. No pertinent surgical history.  Allergies  Allergen Reactions  . Other Rash    Hair dye Hair dye     Social History   Socioeconomic History  . Marital status: Single    Spouse name: Not on file  . Number of children: Not on file  . Years of education: Not on file  . Highest education level: Not on file  Occupational History  . Not on file  Social Needs  . Financial resource strain: Not on file  . Food insecurity:    Worry: Not on file    Inability: Not on file  . Transportation needs:    Medical: Not on file     Non-medical: Not on file  Tobacco Use  . Smoking status: Never Smoker  . Smokeless tobacco: Never Used  Substance and Sexual Activity  . Alcohol use: No  . Drug use: No  . Sexual activity: Not Currently  Lifestyle  . Physical activity:    Days per week: Not on file    Minutes per session: Not on file  . Stress: Not on file  Relationships  . Social connections:    Talks on phone: Not on file    Gets together: Not on file    Attends religious service: Not on file    Active member of club or organization: Not on file    Attends meetings of clubs or organizations: Not on file    Relationship status: Not on file  . Intimate partner violence:    Fear of current or ex partner: Not on file    Emotionally abused: Not on file    Physically abused: Not on file    Forced sexual activity: Not on file  Other Topics Concern  . Not on file  Social History Narrative  . Not on file    Family History  Problem Relation Age of Onset  . Arthritis Mother   . Kidney disease Mother   . Lung cancer Father   . Stroke Sister   .  Hypertension Sister   . Lung cancer Brother   . Heart disease Brother     BP (!) 142/87   Pulse 62   Ht 5' 7" (1.702 m)   Wt 270 lb (122.5 kg)   BMI 42.29 kg/m   Review of Systems: See HPI above.     Objective:  Physical Exam:  Gen: NAD, comfortable in exam room  Back: No gross deformity, scoliosis. No paraspinal tenderness.  No midline or bony TTP. FROM. Strength LEs 5/5 all muscle groups.   2+ MSRs in patellar and achilles tendons, equal bilaterally. Negative SLRs. Sensation intact to light touch bilaterally.  Left hip: No deformity. FROM with 5/5 strength but pain hip flexion. No tenderness to palpation. NVI distally. Negative logroll. Negative fabers, fadirs, piriformis.   Assessment & Plan:  1. Left hip pain - exam is reassuring.  Independently reviewed radiographs but no evidence of cause for her pain (not consistent with osteitis  pubis based on exam).  CT includes hips and no abnormalities to account for her pain here either.  Consistent with overuse strain of hip flexor.  She will try tylenol with prednisone.  If improving add hip flexion exercise.  F/u in 2 weeks for reevaluation.  Consider muscle relaxant, physical therapy if not improving.

## 2018-03-04 ENCOUNTER — Ambulatory Visit (INDEPENDENT_AMBULATORY_CARE_PROVIDER_SITE_OTHER): Payer: BC Managed Care – PPO | Admitting: Family Medicine

## 2018-03-04 ENCOUNTER — Encounter: Payer: Self-pay | Admitting: Family Medicine

## 2018-03-04 DIAGNOSIS — M545 Low back pain, unspecified: Secondary | ICD-10-CM

## 2018-03-04 DIAGNOSIS — M79605 Pain in left leg: Secondary | ICD-10-CM | POA: Diagnosis not present

## 2018-03-04 MED ORDER — HYDROCODONE-ACETAMINOPHEN 5-325 MG PO TABS: 1.0000 | ORAL_TABLET | Freq: Four times a day (QID) | ORAL | 0 refills | Status: DC | PRN

## 2018-03-04 NOTE — Patient Instructions (Signed)
We will go ahead with an MRI of your lumbar spine to assess for a pinched nerve causing this pain. Take tylenol during the day as you have been and the hydrocodone at bedtime for severe pain. Finish the prednisone tomorrow. Starting on Thursday you can take the meloxicam 7.5mg  daily with food. Continue the biofreeze. Follow up will depend on the MRI.

## 2018-03-04 NOTE — Telephone Encounter (Signed)
Had a cancellation today, put her in at 11:30

## 2018-03-04 NOTE — Telephone Encounter (Signed)
Ok ty 

## 2018-03-04 NOTE — Telephone Encounter (Signed)
Patient was informed. Patient does not want to be dependent on medications and has concerns about long term damage from tylenol and aleve.  Patient requesting to be seen this week but does not want to travel to [place]. I have placed her on a wait list for any cancellations

## 2018-03-05 ENCOUNTER — Encounter: Payer: Self-pay | Admitting: Family Medicine

## 2018-03-05 NOTE — Progress Notes (Signed)
PCP: Libby Maw, MD  Subjective:   HPI: Patient is a 67 y.o. female here for low back, left hip pain.  4/23: Patient reports she started to get pain in left side of low back and left hip on 4/19. She didn't have an acute injury but recalls changing flower pots that day and did a lot of moving and squatting. Couldn't sleep on her left side that night. Pain seemed to resolve though and return on Saturday. Tried naproxen and tylenol initially then switched to meloxicam. Pain level up to 10/10 and sharp. Has history of gout. Pain worse with prolonged standing. No bowel/bladder dysfunction. No skin changes, numbness.  4/30: Patient returns with persistent severe left hip pain. Pain goes from the hip area to the back. She does report that her left leg does go numb and feels hot at times. She is currently taking prednisone and Tylenol but has not noticed much benefit with these. Pain is worse with cold weather. Pain level 9 out of 10 and sharp, severe. No bowel or bladder dysfunction.  Past Medical History:  Diagnosis Date  . Arthritis   . Gout     Current Outpatient Medications on File Prior to Visit  Medication Sig Dispense Refill  . acetaminophen (TYLENOL) 500 MG tablet Take 2 tablets (1,000 mg total) by mouth every 8 (eight) hours as needed. 30 tablet 0  . azithromycin (ZITHROMAX) 250 MG tablet Take 2 today and then one each day until finished. (Patient not taking: Reported on 02/24/2018) 6 tablet 0  . Carbamide Peroxide-Saline (EAR WAX CLEANSING) 6.5 % KIT Follow directions on kit. (Patient not taking: Reported on 02/24/2018) 1 kit 2  . meloxicam (MOBIC) 7.5 MG tablet Take 1 tablet (7.5 mg total) by mouth daily. 10 tablet 0  . predniSONE (DELTASONE) 10 MG tablet 6 tabs po day 1, 5 tabs po day 2, 4 tabs po day 3, 3 tabs po day 4, 2 tabs po day 5, 1 tab po day 6 21 tablet 0   No current facility-administered medications on file prior to visit.     History reviewed.  No pertinent surgical history.  Allergies  Allergen Reactions  . Other Rash    Hair dye Hair dye     Social History   Socioeconomic History  . Marital status: Single    Spouse name: Not on file  . Number of children: Not on file  . Years of education: Not on file  . Highest education level: Not on file  Occupational History  . Not on file  Social Needs  . Financial resource strain: Not on file  . Food insecurity:    Worry: Not on file    Inability: Not on file  . Transportation needs:    Medical: Not on file    Non-medical: Not on file  Tobacco Use  . Smoking status: Never Smoker  . Smokeless tobacco: Never Used  Substance and Sexual Activity  . Alcohol use: No  . Drug use: No  . Sexual activity: Not Currently  Lifestyle  . Physical activity:    Days per week: Not on file    Minutes per session: Not on file  . Stress: Not on file  Relationships  . Social connections:    Talks on phone: Not on file    Gets together: Not on file    Attends religious service: Not on file    Active member of club or organization: Not on file    Attends meetings  of clubs or organizations: Not on file    Relationship status: Not on file  . Intimate partner violence:    Fear of current or ex partner: Not on file    Emotionally abused: Not on file    Physically abused: Not on file    Forced sexual activity: Not on file  Other Topics Concern  . Not on file  Social History Narrative  . Not on file    Family History  Problem Relation Age of Onset  . Arthritis Mother   . Kidney disease Mother   . Lung cancer Father   . Stroke Sister   . Hypertension Sister   . Lung cancer Brother   . Heart disease Brother     BP 130/70   Pulse 66   Ht 5' 7"  (1.702 m)   Wt 270 lb (122.5 kg)   BMI 42.29 kg/m   Review of Systems: See HPI above.     Objective:  Physical Exam:  Gen: NAD, sitting in wheelchair in exam room.  Back: No gross deformity, scoliosis. No paraspinal  tenderness.  No midline or bony TTP. FROM. Strength LEs 5/5 all muscle groups except 3/5 left hip flexion.   2+ MSRs in patellar and achilles tendons, equal bilaterally. Negative SLRs. Sensation intact to light touch bilaterally.  Left hip: No deformity. FROM with 5/5 strength except left hip flexion 3/5.   Mild tenderness left gluteal region, hip external rotators, over hip flexor. Negative logroll Negative fabers and piriformis stretches.   Assessment & Plan:  1. Left hip pain -concern given the amount of pain that she is having.  She is having more difficulty with left hip flexion though would not expect simple overuse hip flexor strain to persist in this level of pain.  Radiographs and CT did not show obvious cause of her pain.  She did not notice much improvement with the prednisone either.  She has a negative logroll and no evidence of intra-articular hip pathology that would be picked up by an MRI that was not seen on the CT scan.  We did discuss possibility of her having a radiculopathy that is mimicking hip pathology this would account for her level of pain advised we go ahead with an MRI of her lumbar spine.  When she finishes the prednisone she can start meloxicam.  Tylenol during the day with hydrocodone at bedtime.  Continue Biofreeze.

## 2018-03-05 NOTE — Assessment & Plan Note (Signed)
concern given the amount of pain that she is having.  She is having more difficulty with left hip flexion though would not expect simple overuse hip flexor strain to persist in this level of pain.  Radiographs and CT did not show obvious cause of her pain.  She did not notice much improvement with the prednisone either.  She has a negative logroll and no evidence of intra-articular hip pathology that would be picked up by an MRI that was not seen on the CT scan.  We did discuss possibility of her having a radiculopathy that is mimicking hip pathology this would account for her level of pain advised we go ahead with an MRI of her lumbar spine.  When she finishes the prednisone she can start meloxicam.  Tylenol during the day with hydrocodone at bedtime.  Continue Biofreeze.

## 2018-03-06 ENCOUNTER — Other Ambulatory Visit: Payer: Self-pay

## 2018-03-06 ENCOUNTER — Encounter (HOSPITAL_COMMUNITY): Payer: Self-pay | Admitting: Emergency Medicine

## 2018-03-06 ENCOUNTER — Emergency Department (HOSPITAL_COMMUNITY)
Admission: EM | Admit: 2018-03-06 | Discharge: 2018-03-06 | Disposition: A | Discharge status: Home / Self Care | Payer: Medicare Other | Attending: Emergency Medicine | Admitting: Emergency Medicine

## 2018-03-06 DIAGNOSIS — M25552 Pain in left hip: Secondary | ICD-10-CM | POA: Insufficient documentation

## 2018-03-06 DIAGNOSIS — Z79899 Other long term (current) drug therapy: Secondary | ICD-10-CM | POA: Insufficient documentation

## 2018-03-06 NOTE — ED Notes (Signed)
Pt ambulated to bathroom without assistance 

## 2018-03-06 NOTE — ED Triage Notes (Signed)
Pt reports having left hip pain that started 2 weeks ago and has not improved with pain medications. Pt did have xrays and CT scan done at [facility] and [facility].

## 2018-03-06 NOTE — ED Notes (Signed)
Changing flower pots on good Friday  Heavy , bending over injured left hip.  Now c/o of left sided abdominal pain.  Sunday - nausea, set on.

## 2018-03-06 NOTE — ED Provider Notes (Signed)
[facility] DEPT Provider Note   CSN: [id number] Arrival date & time: 03/06/18  0405     History   Chief Complaint Chief Complaint  Patient presents with  . Hip Pain    HPI Shirley Thornton is a 67 y.o. female.  67 year old female presents to the emergency department for evaluation of persistent left hip pain.  She reports moving heavy flowerpots on 02/21/2018 when she felt a pain in her left hip.  This initially improved, but recurred the following day.  She was evaluated in the emergency department on both 02/23/2018 and 02/24/2018.  X-ray and CT scans during these visits were reassuring.  The patient subsequently followed up with a sports medicine physician on 02/25/2018.  She was additionally seen on 03/04/2018.  She has been on a course of steroids as well as continued anti-inflammatories with the use of Biofreeze and Norco at nighttime for pain control.  She states that her pain remains sharp and persistent.  She describes her pain is severe.  It will sometimes radiate along the outer aspect of her thigh.  She denies any changes in extremity sensation or strength.  No bowel or bladder incontinence or history of fevers.  She states that she is supposed to have an MRI completed, but they have not told her when this is to be done.  She is wanting to know what is wrong, causing her pain.     Past Medical History:  Diagnosis Date  . Arthritis   . Gout     Patient Active Problem List   Diagnosis Date Noted  . Low back pain radiating to left leg 03/03/2018  . Ceruminosis, bilateral 12/18/2017  . Bronchitis 12/18/2017  . Allergic reaction 12/18/2017    History reviewed. No pertinent surgical history.   OB History   None      Home Medications    Prior to Admission medications   Medication Sig Start Date End Date Taking? Authorizing Provider  acetaminophen (TYLENOL) 500 MG tablet Take 2 tablets (1,000 mg total) by mouth every 8 (eight) hours as  needed. 02/23/18   Little, Wenda Overland, MD  azithromycin (ZITHROMAX) 250 MG tablet Take 2 today and then one each day until finished. Patient not taking: Reported on 02/24/2018 12/18/17   Libby Maw, MD  Carbamide Peroxide-Saline (EAR WAX CLEANSING) 6.5 % KIT Follow directions on kit. Patient not taking: Reported on 02/24/2018 12/18/17   Libby Maw, MD  HYDROcodone-acetaminophen [facility]) 5-325 MG tablet Take 1 tablet by mouth every 6 (six) hours as needed for moderate pain. 03/04/18   Hudnall, Sharyn Lull, MD  meloxicam (MOBIC) 7.5 MG tablet Take 1 tablet (7.5 mg total) by mouth daily. 02/24/18   Lorin Glass, PA-C  predniSONE (DELTASONE) 10 MG tablet 6 tabs po day 1, 5 tabs po day 2, 4 tabs po day 3, 3 tabs po day 4, 2 tabs po day 5, 1 tab po day 6 02/25/18   Hudnall, Sharyn Lull, MD    Family History Family History  Problem Relation Age of Onset  . Arthritis Mother   . Kidney disease Mother   . Lung cancer Father   . Stroke Sister   . Hypertension Sister   . Lung cancer Brother   . Heart disease Brother     Social History Social History   Tobacco Use  . Smoking status: Never Smoker  . Smokeless tobacco: Never Used  Substance Use Topics  . Alcohol use: No  . Drug  use: No     Allergies   Other   Review of Systems Review of Systems Ten systems reviewed and are negative for acute change, except as noted in the HPI.    Physical Exam Updated Vital Signs BP (!) 179/93 (BP Location: Right Arm)   Pulse 64   Temp 97.8 F (36.6 C) (Oral)   Resp 17   Ht 5' 7" (1.702 m)   Wt 122.5 kg (270 lb)   SpO2 100%   BMI 42.29 kg/m   Physical Exam  Constitutional: She is oriented to person, place, and time. She appears well-developed and well-nourished. No distress.  Nontoxic appearing and in NAD. Speaking very sternly.   HENT:  Head: Normocephalic and atraumatic.  Eyes: Conjunctivae and EOM are normal. No scleral icterus.  Neck: Normal range of motion.    Pulmonary/Chest: Effort normal. No respiratory distress.  Respirations even and unlabored  Musculoskeletal: Normal range of motion.  Neurological: She is alert and oriented to person, place, and time. She exhibits normal muscle tone. Coordination normal.  GCS 15. Ambulatory in the ED without difficulty. Moving all extremities spontaneously.  Skin: Skin is warm and dry. No rash noted. She is not diaphoretic. No erythema. No pallor.  Psychiatric: She has a normal mood and affect. She is agitated.  Nursing note and vitals reviewed.    ED Treatments / Results  Labs (all labs ordered are listed, but only abnormal results are displayed) Labs Reviewed - No data to display  EKG None  Radiology No results found.  Procedures Procedures (including critical care time)  Medications Ordered in ED Medications - No data to display   Initial Impression / Assessment and Plan / ED Course  I have reviewed the triage vital signs and the nursing notes.  Pertinent labs & imaging results that were available during my care of the patient were reviewed by me and considered in my medical decision making (see chart for details).     67 year old female presenting for persistent left hip pain.  She is neurovascularly intact and ambulatory in the department.  No red flags or signs concerning for cauda equina.  She is requesting an MRI be completed for evaluation of her persistent pain.  Given chronicity of symptoms with reassuring features, I do not see emergent indication for this study.  She has seen sports medicine on 2 occasions over the past week and is currently undergoing scheduling for an MRI.  I have tried to provide encouragement for continued symptomatic control.  I have offered to adjust the patient's pain prescription which she declined.  She may benefit from switching Biofreeze to her topical Salonpas patches with lidocaine.  Have also discussed the use of a TENS unit.  Patient visibly  frustrated, but verbalizes understanding.  Return precautions discussed and provided. Patient discharged in stable condition with no unaddressed concerns.   Final Clinical Impressions(s) / ED Diagnoses   Final diagnoses:  Left hip pain    ED Discharge Orders    None       Antonietta Breach, PA-C 03/06/18 4619    Daleen Bo, MD 03/06/18 [phone]

## 2018-03-06 NOTE — Discharge Instructions (Addendum)
Continue your prescribed medications.  You may benefit from substituting Biofreeze for salon pause patches over-the-counter which contain lidocaine.  You may also try obtaining a TENS unit for symptomatic control.  Follow-up with your sports medicine physician for continued evaluation.

## 2018-03-08 ENCOUNTER — Encounter

## 2018-03-12 ENCOUNTER — Encounter: Payer: Self-pay | Admitting: Family Medicine

## 2018-03-12 ENCOUNTER — Ambulatory Visit (INDEPENDENT_AMBULATORY_CARE_PROVIDER_SITE_OTHER): Payer: Medicare Other | Admitting: Family Medicine

## 2018-03-12 VITALS — BP 138/80 | HR 69 | Ht 67.0 in | Wt 270.0 lb

## 2018-03-12 DIAGNOSIS — M545 Low back pain: Secondary | ICD-10-CM | POA: Diagnosis not present

## 2018-03-12 DIAGNOSIS — M79605 Pain in left leg: Secondary | ICD-10-CM

## 2018-03-12 NOTE — Progress Notes (Signed)
Subjective:  Patient ID: Shirley Thornton, female    DOB: [birth year]  Age: 67 y.o. MRN: [id number]  CC: left hip pain   HPI Shirley Thornton presents for follow-up of her left hip pain.  The injury that led to her pain seemed to occur on Good Friday when she was lifting some flowerpots the wrong way.  Since that time she is experienced pain in her left hip area that has impacted her life.  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  With her ongoing pain and MRI has been ordered.  She was unable to tolerate these small to for her ordered MRI.  After our visit today she is going to look at another MRI machine with a larger tube.  She blames herself for the injury saying that I did something stupid  Outpatient Medications Prior to Visit  Medication Sig Dispense Refill  . acetaminophen (TYLENOL) 500 MG tablet Take 2 tablets (1,000 mg total) by mouth every 8 (eight) hours as needed. 30 tablet 0  . HYDROcodone-acetaminophen (NORCO) 5-325 MG tablet Take 1 tablet by mouth every 6 (six) hours as needed for moderate pain. 20 tablet 0  . meloxicam (MOBIC) 7.5 MG tablet Take 1 tablet (7.5 mg total) by mouth daily. 10 tablet 0  . azithromycin (ZITHROMAX) 250 MG tablet Take 2 today and then one each day until finished. (Patient not taking: Reported on 02/24/2018) 6 tablet 0  . Carbamide Peroxide-Saline (EAR WAX CLEANSING) 6.5 % KIT Follow directions on kit. (Patient not taking: Reported on 02/24/2018) 1 kit 2  . predniSONE (DELTASONE) 10 MG tablet 6 tabs po day 1, 5 tabs po day 2, 4 tabs po day 3, 3 tabs po day 4, 2 tabs po day 5, 1 tab po day 6 21 tablet 0   No facility-administered medications prior to visit.     ROS Review of Systems  Constitutional: Negative.   Musculoskeletal: Positive for arthralgias and myalgias.  Neurological: Negative.     Psychiatric/Behavioral: The patient is nervous/anxious.     Objective:  BP 138/80   Pulse 69   Ht 5' 7" (1.702 m)   Wt 270 lb (122.5 kg)   SpO2 100%   BMI 42.29 kg/m   BP Readings from Last 3 Encounters:  03/12/18 138/80  03/06/18 (!) 179/93  03/04/18 130/70    Wt Readings from Last 3 Encounters:  03/12/18 270 lb (122.5 kg)  03/06/18 270 lb (122.5 kg)  03/04/18 270 lb (122.5 kg)    Physical Exam  Constitutional: She is oriented to person, place, and time. She appears well-developed and well-nourished. No distress.  HENT:  Head: Normocephalic and atraumatic.  Right Ear: External ear normal.  Eyes: Right eye exhibits no discharge. Left eye exhibits no discharge. No scleral icterus.  Pulmonary/Chest: Effort normal.  Neurological: She is alert and oriented to person, place, and time.  Skin: Skin is warm and dry. She is not diaphoretic.  Psychiatric: She has a normal mood and affect. Her behavior is normal.    Lab Results  Component Value Date   WBC 4.9 02/24/2018   HGB 12.4 02/24/2018   HCT 36.9 02/24/2018   PLT 202 02/24/2018   GLUCOSE 97 02/24/2018   CHOL 187 10/04/2015   TRIG 56.0 10/04/2015   HDL 49.00 10/04/2015   LDLCALC 127 (H) 10/04/2015   ALT  21 10/04/2015   AST 25 10/04/2015   NA 136 02/24/2018   K 3.4 (L) 02/24/2018   CL 105 02/24/2018   CREATININE 0.99 02/24/2018   BUN 12 02/24/2018   CO2 21 (L) 02/24/2018   HGBA1C 5.4 10/04/2015    No results found.  Assessment & Plan:   There are no diagnoses linked to this encounter. I have discontinued Shirley Thornton's azithromycin, EAR WAX CLEANSING, and predniSONE. I am also having her maintain her acetaminophen, meloxicam, and HYDROcodone-acetaminophen.  No orders of the defined types were placed in this encounter. Reassured the patient that injury could have happened for any reason.  She is aware of proper lifting technique and I demonstrated that in the room again.  I asked her to be gentle with  herself.  I demonstrated relaxation breathing and invited her to use that for her upcoming MRI study.  She did say that she has no one to drive her to the facility.   Follow-up: Return if symptoms worsen or fail to improve.  Libby Maw, MD

## 2018-03-12 NOTE — Addendum Note (Signed)
Addended by: Kathi Simpers F on: 03/12/2018 09:00 AM   Modules accepted: Orders

## 2018-03-13 ENCOUNTER — Ambulatory Visit: Payer: BC Managed Care – PPO | Admitting: Family Medicine

## 2018-03-14 ENCOUNTER — Ambulatory Visit
Admission: RE | Admit: 2018-03-14 | Discharge: 2018-03-14 | Disposition: A | Discharge status: Home / Self Care | Payer: Medicare Other | Source: Ambulatory Visit | Attending: Family Medicine | Admitting: Family Medicine

## 2018-03-14 DIAGNOSIS — M545 Low back pain, unspecified: Secondary | ICD-10-CM

## 2018-03-14 DIAGNOSIS — M79605 Pain in left leg: Principal | ICD-10-CM

## 2018-03-17 ENCOUNTER — Ambulatory Visit: Payer: BC Managed Care – PPO | Admitting: Family Medicine

## 2018-03-17 ENCOUNTER — Telehealth: Payer: Self-pay | Admitting: Family Medicine

## 2018-03-17 NOTE — Telephone Encounter (Signed)
Patient went to have her MRI done last Friday, but states she was unable to lay still due to having to lay on a hard surface that caused pain on her left side.  Patient has rescheduled her MRI for tomorrow, 5/14, at 6:50am and is requesting pain medication to help her be able to lay still   Pharmacy :  [facility] on [address] in [place]

## 2018-03-17 NOTE — Telephone Encounter (Signed)
Patient does not need the valium. She will take 2 of the norco Patient was informed not to drive on medication

## 2018-03-17 NOTE — Telephone Encounter (Signed)
Patient just found the Norco medication. States she still has about 7 tablets

## 2018-03-17 NOTE — Telephone Encounter (Signed)
Does she still have some of the norco from last office visit?  We could give her a couple tablets of valium that should help relax her and may help with the pain as well.

## 2018-03-17 NOTE — Telephone Encounter (Signed)
Spoke to patient and she did not see any Norco. Please send the valium to the [facility] on [address] in [place].

## 2018-03-17 NOTE — Telephone Encounter (Signed)
Ok since she found the Norco does she want to wait on the valium?  She could take 2 of the norco prior to her MRI but someone would have to drive her.

## 2018-03-18 ENCOUNTER — Ambulatory Visit
Admission: RE | Admit: 2018-03-18 | Discharge: 2018-03-18 | Disposition: A | Discharge status: Home / Self Care | Payer: Medicare Other | Source: Ambulatory Visit | Attending: Family Medicine | Admitting: Family Medicine

## 2018-03-18 ENCOUNTER — Other Ambulatory Visit: Payer: Self-pay | Admitting: Family Medicine

## 2018-03-18 ENCOUNTER — Encounter: Payer: Medicare Other | Admitting: Family Medicine

## 2018-03-18 DIAGNOSIS — M79605 Pain in left leg: Principal | ICD-10-CM

## 2018-03-18 DIAGNOSIS — M545 Low back pain: Secondary | ICD-10-CM

## 2018-03-20 NOTE — Addendum Note (Signed)
Addended by: Kathi Simpers F on: 03/20/2018 09:10 AM   Modules accepted: Orders

## 2018-03-21 ENCOUNTER — Other Ambulatory Visit: Payer: Self-pay | Admitting: Family Medicine

## 2018-03-21 ENCOUNTER — Ambulatory Visit
Admission: RE | Admit: 2018-03-21 | Discharge: 2018-03-21 | Disposition: A | Discharge status: Home / Self Care | Payer: Medicare Other | Source: Ambulatory Visit | Attending: Family Medicine | Admitting: Family Medicine

## 2018-03-21 DIAGNOSIS — M545 Low back pain: Principal | ICD-10-CM

## 2018-03-21 DIAGNOSIS — G8929 Other chronic pain: Secondary | ICD-10-CM

## 2018-03-21 MED ORDER — IOPAMIDOL (ISOVUE-M 200) INJECTION 41%
1.0000 mL | Freq: Once | INTRAMUSCULAR | Status: AC
  Administered 2018-03-21: 1 mL via EPIDURAL

## 2018-03-21 MED ORDER — METHYLPREDNISOLONE ACETATE 40 MG/ML INJ SUSP (RADIOLOG
120.0000 mg | Freq: Once | INTRAMUSCULAR | Status: AC
  Administered 2018-03-21: 120 mg via EPIDURAL

## 2018-03-21 NOTE — Discharge Instructions (Signed)

## 2018-04-30 ENCOUNTER — Encounter: Payer: Medicare Other | Admitting: Family Medicine

## 2018-12-29 ENCOUNTER — Other Ambulatory Visit: Payer: Self-pay

## 2018-12-29 ENCOUNTER — Emergency Department
Admission: EM | Admit: 2018-12-29 | Discharge: 2018-12-29 | Disposition: A | Discharge status: Home / Self Care | Payer: Medicare Other | Attending: Student in an Organized Health Care Education/Training Program | Admitting: Student in an Organized Health Care Education/Training Program

## 2018-12-29 DIAGNOSIS — R42 Dizziness and giddiness: Secondary | ICD-10-CM | POA: Diagnosis not present

## 2018-12-29 DIAGNOSIS — R112 Nausea with vomiting, unspecified: Secondary | ICD-10-CM

## 2018-12-29 LAB — URINALYSIS, COMPLETE (UACMP) WITH MICROSCOPIC
Bacteria, UA: NONE SEEN
Bilirubin Urine: NEGATIVE
Glucose, UA: NEGATIVE mg/dL
Hgb urine dipstick: NEGATIVE
Ketones, ur: NEGATIVE mg/dL
Nitrite: NEGATIVE
Protein, ur: NEGATIVE mg/dL
Specific Gravity, Urine: 1.017 (ref 1.005–1.030)
pH: 5 (ref 5.0–8.0)

## 2018-12-29 LAB — CBC
HEMATOCRIT: 46 % (ref 36.0–46.0)
Hemoglobin: 14.6 g/dL (ref 12.0–15.0)
MCH: 30 pg (ref 26.0–34.0)
MCHC: 31.7 g/dL (ref 30.0–36.0)
MCV: 94.7 fL (ref 80.0–100.0)
Platelets: 191 10*3/uL (ref 150–400)
RBC: 4.86 MIL/uL (ref 3.87–5.11)
RDW: 12.6 % (ref 11.5–15.5)
WBC: 6.4 10*3/uL (ref 4.0–10.5)
nRBC: 0 % (ref 0.0–0.2)

## 2018-12-29 LAB — BASIC METABOLIC PANEL
ANION GAP: 10 (ref 5–15)
BUN: 17 mg/dL (ref 8–23)
CHLORIDE: 105 mmol/L (ref 98–111)
CO2: 24 mmol/L (ref 22–32)
Calcium: 9.5 mg/dL (ref 8.9–10.3)
Creatinine, Ser: 0.83 mg/dL (ref 0.44–1.00)
GFR calc non Af Amer: >60 mL/min (ref >60)
GLUCOSE: 88 mg/dL (ref 70–99)
Potassium: 3.9 mmol/L (ref 3.5–5.1)
Sodium: 139 mmol/L (ref 135–145)

## 2018-12-29 MED ORDER — MECLIZINE HCL 12.5 MG PO TABS: 12.5000 mg | ORAL_TABLET | Freq: Three times a day (TID) | ORAL | 0 refills | Status: DC | PRN

## 2018-12-29 MED ORDER — MECLIZINE HCL 12.5 MG PO TABS
12.5000 mg | ORAL_TABLET | Freq: Once | ORAL | Status: AC
  Administered 2018-12-29: 12.5 mg via ORAL
  Filled 2018-12-29 (×2): qty 1

## 2018-12-29 MED ORDER — SODIUM CHLORIDE 0.9% FLUSH: 3.0000 mL | Freq: Once | INTRAVENOUS | Status: DC

## 2018-12-29 MED ORDER — SODIUM CHLORIDE 0.9 % IV BOLUS: 500.0000 mL | Freq: Once | INTRAVENOUS | Status: DC

## 2018-12-29 NOTE — ED Notes (Signed)
Attempted iv access/blood draw in left hand andin right antecub without success.  edt had already tried to draw blood from right ac

## 2018-12-29 NOTE — ED Notes (Signed)
attempt IV x 4, 3 staff, unsuccessful. Po challenge.

## 2018-12-29 NOTE — ED Triage Notes (Addendum)
Pt c/o N/V/D and dizziness since early this morning. Pt states she does have a hx of vertigo. Pt states she has been under a lot of stress having to care and having 2 family members recently placed in a nursing home from [place]

## 2018-12-29 NOTE — ED Provider Notes (Signed)
[facility] Emergency Department Provider Note    First MD Initiated Contact with Patient 12/29/18 1517     (approximate)  I have reviewed the triage vital signs and the nursing notes.   HISTORY  Chief Complaint Dizziness    HPI Shirley Thornton is a 68 y.o. female below listed past medical history as well as a history of vertigo back in 2014 presents the ER for evaluation of dizziness is associated with nausea vomiting and diarrhea.  Woke up early this morning feeling nauseated.  She stood up and while she was in the room felt like the room was spinning.  She then had several episodes of nonbloody non-melanotic diarrhea.  Has not had any fevers.  States that she has been very stressed having to care for to family emergency recently moved and are in nursing homes now.  States that she does feel stressed out.  Denies any abdominal pain at this time.  Did have some crampy abdominal pain prior to the diarrhea.  Denies any dysuria.  No chest pain or shortness of breath.    Past Medical History:  Diagnosis Date  . Arthritis   . Gout    Family History  Problem Relation Age of Onset  . Arthritis Mother   . Kidney disease Mother   . Lung cancer Father   . Stroke Sister   . Hypertension Sister   . Lung cancer Brother   . Heart disease Brother    History reviewed. No pertinent surgical history. Patient Active Problem List   Diagnosis Date Noted  . Low back pain radiating to left leg 03/03/2018  . Ceruminosis, bilateral 12/18/2017  . Bronchitis 12/18/2017  . Allergic reaction 12/18/2017      Prior to Admission medications   Medication Sig Start Date End Date Taking? Authorizing Provider  acetaminophen (TYLENOL) 500 MG tablet Take 2 tablets (1,000 mg total) by mouth every 8 (eight) hours as needed. 02/23/18   Little, Ambrose Finland, MD  HYDROcodone-acetaminophen (NORCO) 5-325 MG tablet Take 1 tablet by mouth every 6 (six) hours as needed for moderate pain.  03/04/18   Hudnall, Azucena Fallen, MD  meloxicam (MOBIC) 7.5 MG tablet Take 1 tablet (7.5 mg total) by mouth daily. 02/24/18   Cristina Gong, PA-C    Allergies Other    Social History Social History   Tobacco Use  . Smoking status: Never Smoker  . Smokeless tobacco: Never Used  Substance Use Topics  . Alcohol use: No  . Drug use: No    Review of Systems Patient denies headaches, rhinorrhea, blurry vision, numbness, shortness of breath, chest pain, edema, cough, abdominal pain, nausea, vomiting, diarrhea, dysuria, fevers, rashes or hallucinations unless otherwise stated above in HPI. ____________________________________________   PHYSICAL EXAM:  VITAL SIGNS: Vitals:   12/29/18 1123 12/29/18 1500  BP: 135/73 (!) 147/72  Pulse: (!) 56 (!) 51  Resp: 17 18  Temp: 98.4 F (36.9 C)   SpO2: 100% 100%    Constitutional: Alert and oriented.  Eyes: Conjunctivae are normal.  Head: Atraumatic. Nose: No congestion/rhinnorhea. Mouth/Throat: Mucous membranes are moist.   Neck: No stridor. Painless ROM.  Cardiovascular: Normal rate, regular rhythm. Grossly normal heart sounds.  Good peripheral circulation. Respiratory: Normal respiratory effort.  No retractions. Lungs CTAB. Gastrointestinal: Soft and nontender. No distention. No abdominal bruits. No CVA tenderness. Genitourinary:  Musculoskeletal: No lower extremity tenderness nor edema.  No joint effusions. Neurologic:  CN- intact.  No facial droop, Normal FNF.  Normal  heel to shin.  Sensation intact bilaterally. Normal speech and language. No gross focal neurologic deficits are appreciated. No gait instability. Skin:  Skin is warm, dry and intact. No rash noted. Psychiatric: Mood and affect are normal. Speech and behavior are normal.  ____________________________________________   LABS (all labs ordered are listed, but only abnormal results are displayed)  Results for orders placed or performed during the hospital encounter  of 12/29/18 (from the past 24 hour(s))  Urinalysis, Complete w Microscopic     Status: Abnormal   Collection Time: 12/29/18 11:49 AM  Result Value Ref Range   Color, Urine YELLOW (A) YELLOW   APPearance CLEAR (A) CLEAR   Specific Gravity, Urine 1.017 1.005 - 1.030   pH 5.0 5.0 - 8.0   Glucose, UA NEGATIVE NEGATIVE mg/dL   Hgb urine dipstick NEGATIVE NEGATIVE   Bilirubin Urine NEGATIVE NEGATIVE   Ketones, ur NEGATIVE NEGATIVE mg/dL   Protein, ur NEGATIVE NEGATIVE mg/dL   Nitrite NEGATIVE NEGATIVE   Leukocytes,Ua TRACE (A) NEGATIVE   RBC / HPF 0-5 0 - 5 RBC/hpf   WBC, UA 0-5 0 - 5 WBC/hpf   Bacteria, UA NONE SEEN NONE SEEN   Squamous Epithelial / LPF 0-5 0 - 5   Mucus PRESENT   Basic metabolic panel     Status: None   Collection Time: 12/29/18 12:21 PM  Result Value Ref Range   Sodium 139 135 - 145 mmol/L   Potassium 3.9 3.5 - 5.1 mmol/L   Chloride 105 98 - 111 mmol/L   CO2 24 22 - 32 mmol/L   Glucose, Bld 88 70 - 99 mg/dL   BUN 17 8 - 23 mg/dL   Creatinine, Ser 2.77 0.44 - 1.00 mg/dL   Calcium 9.5 8.9 - 41.2 mg/dL   GFR calc non Af Amer >60 >60 mL/min   GFR calc Af Amer >60 >60 mL/min   Anion gap 10 5 - 15  CBC     Status: None   Collection Time: 12/29/18 12:21 PM  Result Value Ref Range   WBC 6.4 4.0 - 10.5 K/uL   RBC 4.86 3.87 - 5.11 MIL/uL   Hemoglobin 14.6 12.0 - 15.0 g/dL   HCT 87.8 67.6 - 72.0 %   MCV 94.7 80.0 - 100.0 fL   MCH 30.0 26.0 - 34.0 pg   MCHC 31.7 30.0 - 36.0 g/dL   RDW 94.7 09.6 - 28.3 %   Platelets 191 150 - 400 K/uL   nRBC 0.0 0.0 - 0.2 %   ____________________________________________  EKG My review and personal interpretation at Time: 11:26   Indication: dizziness  Rate: 60  Rhythm: sinus Axis: normal Other: normal intervals, no stemi ____________________________________________  RADIOLOGY  I personally reviewed all radiographic images ordered to evaluate for the above acute complaints and reviewed radiology reports and findings.  These  findings were personally discussed with the patient.  Please see medical record for radiology report.  ____________________________________________   PROCEDURES  Procedure(s) performed:  Procedures    Critical Care performed: no ____________________________________________   INITIAL IMPRESSION / ASSESSMENT AND PLAN / ED COURSE  Pertinent labs & imaging results that were available during my care of the patient were reviewed by me and considered in my medical decision making (see chart for details).   DDX: Dehydration, vasovagal, vertigo, stress, anemia, electrolyte abnormality  Yoeli Matheney is a 68 y.o. who presents to the ED with symptoms as described above.  She is nontoxic-appearing.  Afebrile and hemodynamically stable.  EKG shows no evidence of ischemic changes.  Not consistent with ACS.  Do suspect some component of mild dehydration with nausea vomiting diarrhea but patient not toxic.  No focal neuro deficits.  Does not seem clinically consistent with central process.  Clinical Course as of Dec 29 1718  Mon Dec 29, 2018  1718 Patient with improvement in symptoms.  She is tolerating oral hydration.  Significant improvement after meclizine.  At this point I do believe she stable and appropriate for outpatient follow-up.   [PR]    Clinical Course User Index [PR] Willy Eddy, MD     As part of my medical decision making, I reviewed the following data within the electronic MEDICAL RECORD NUMBER Nursing notes reviewed and incorporated, Labs reviewed, notes from prior ED visits and [place] Controlled Substance Database   ____________________________________________   FINAL CLINICAL IMPRESSION(S) / ED DIAGNOSES  Final diagnoses:  Nausea and vomiting, intractability of vomiting not specified, unspecified vomiting type  Vertigo      NEW MEDICATIONS STARTED DURING THIS VISIT:  New Prescriptions   No medications on file     Note:  This document was prepared using  Dragon voice recognition software and may include unintentional dictation errors.    Willy Eddy, MD 12/29/18 1750

## 2018-12-29 NOTE — ED Notes (Signed)
First Nurse Note: Patient to ED from [facility] with AMS since Saturday, N&V this AM with Dizziness.

## 2018-12-29 NOTE — ED Notes (Signed)
Ambulate in hall, gait steady.  

## 2019-05-17 IMAGING — DX DG HIP (WITH OR WITHOUT PELVIS) 2-3V*L*
3 series · 3 of 3 positions shown · non-contrast
Comparison: None.

CLINICAL DATA: 66-year-old female with left hip pain. No known fall
or injury. Patient was lifting flower pot yesterday and has a
history of gout.

EXAM:
DG HIP (WITH OR WITHOUT PELVIS) 2-3V LEFT

[pelvis ap]
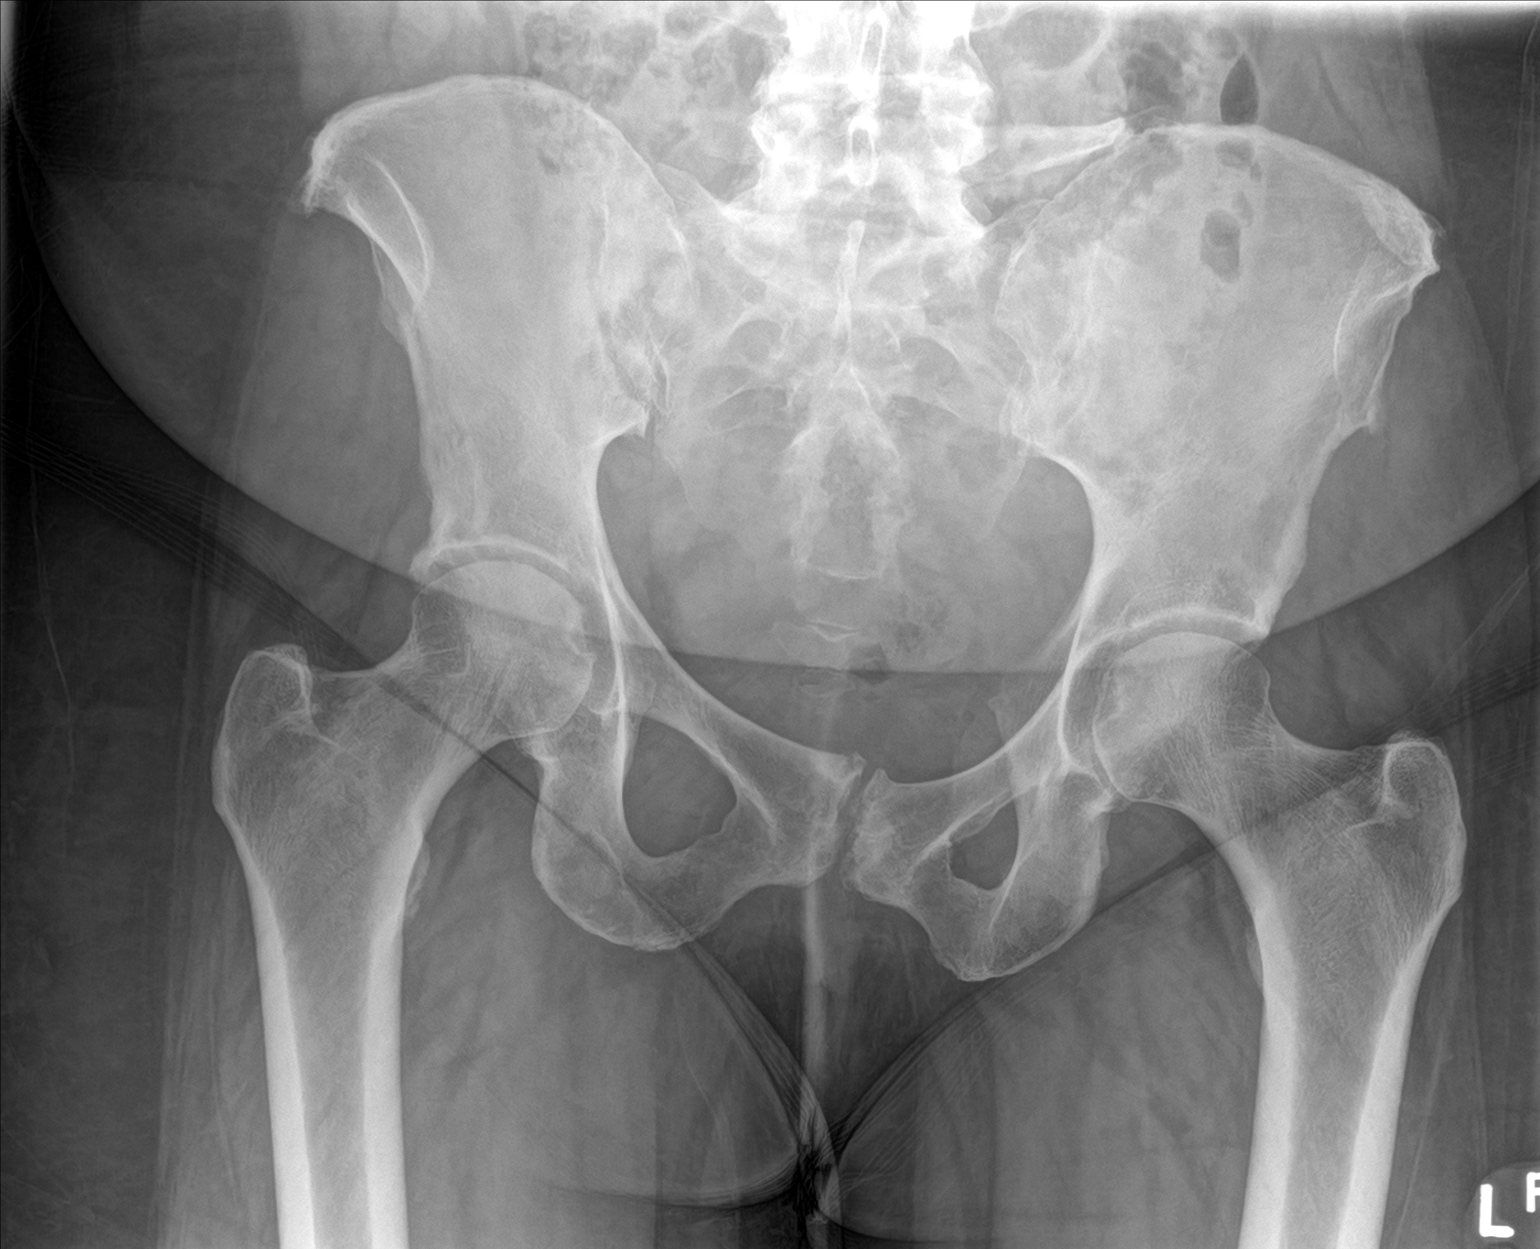

[hip ap]
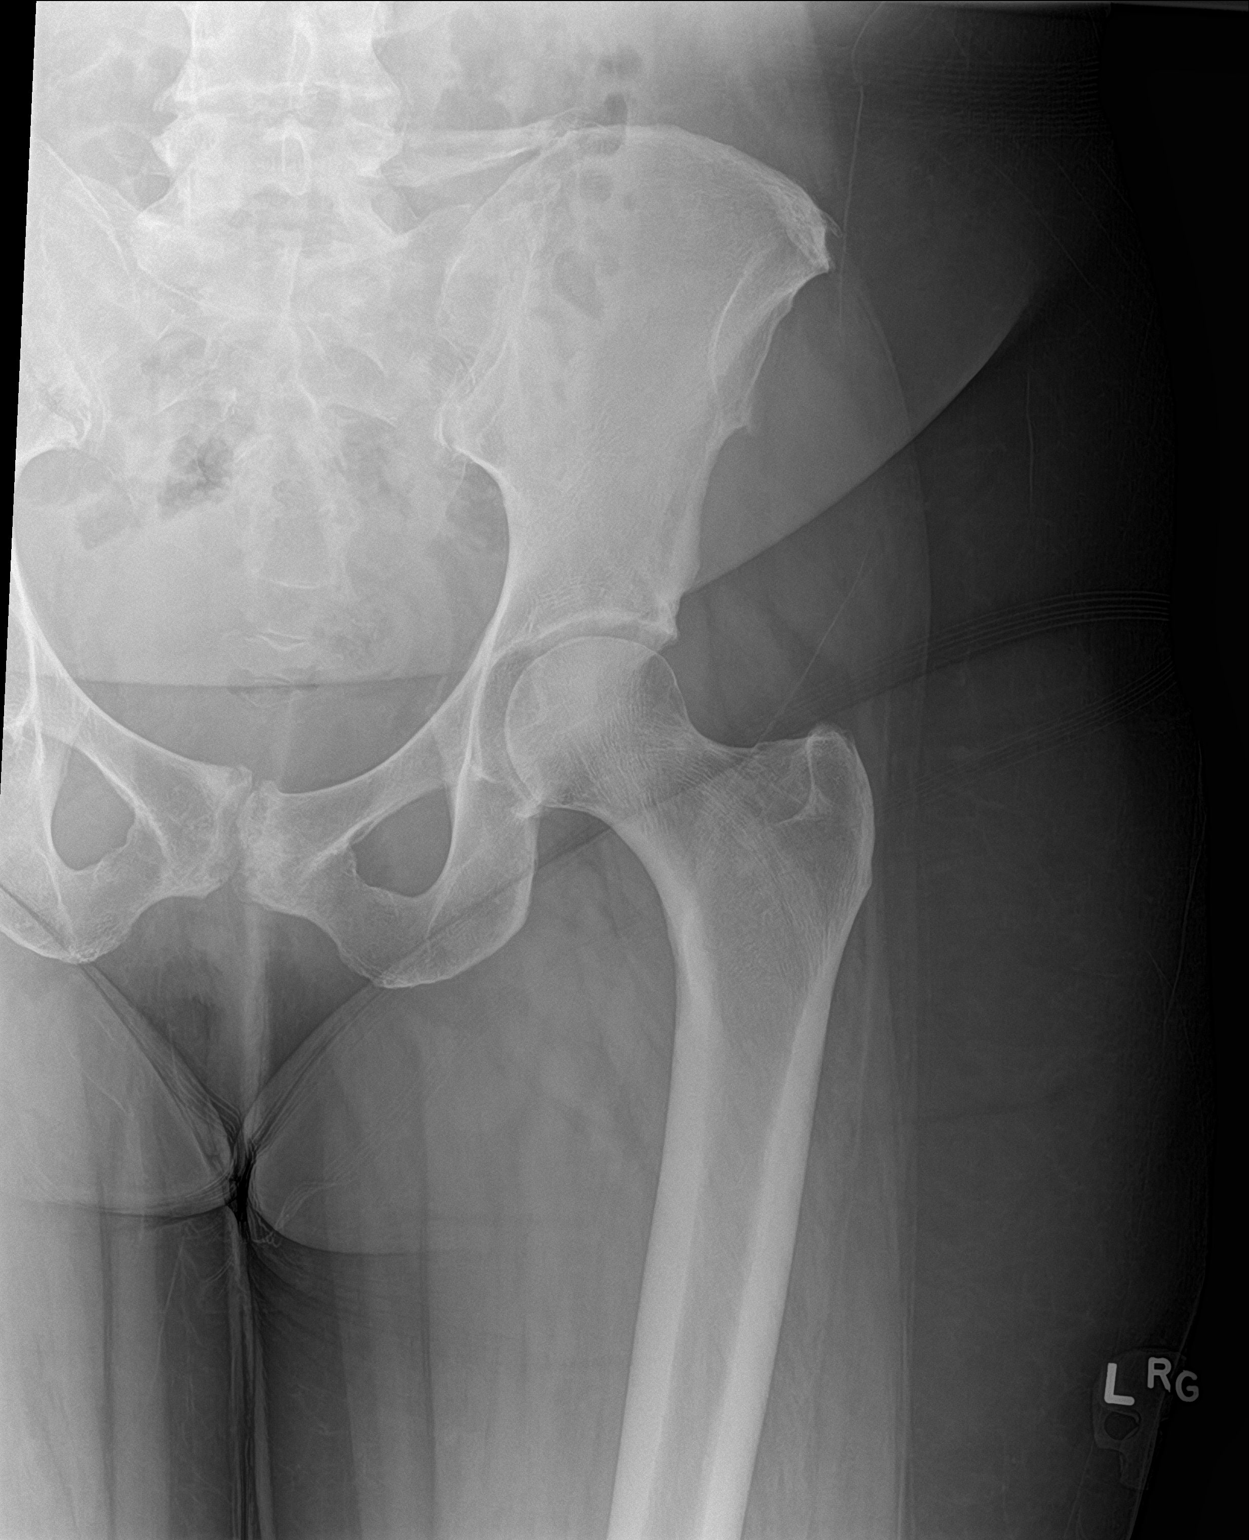

[hip lat]
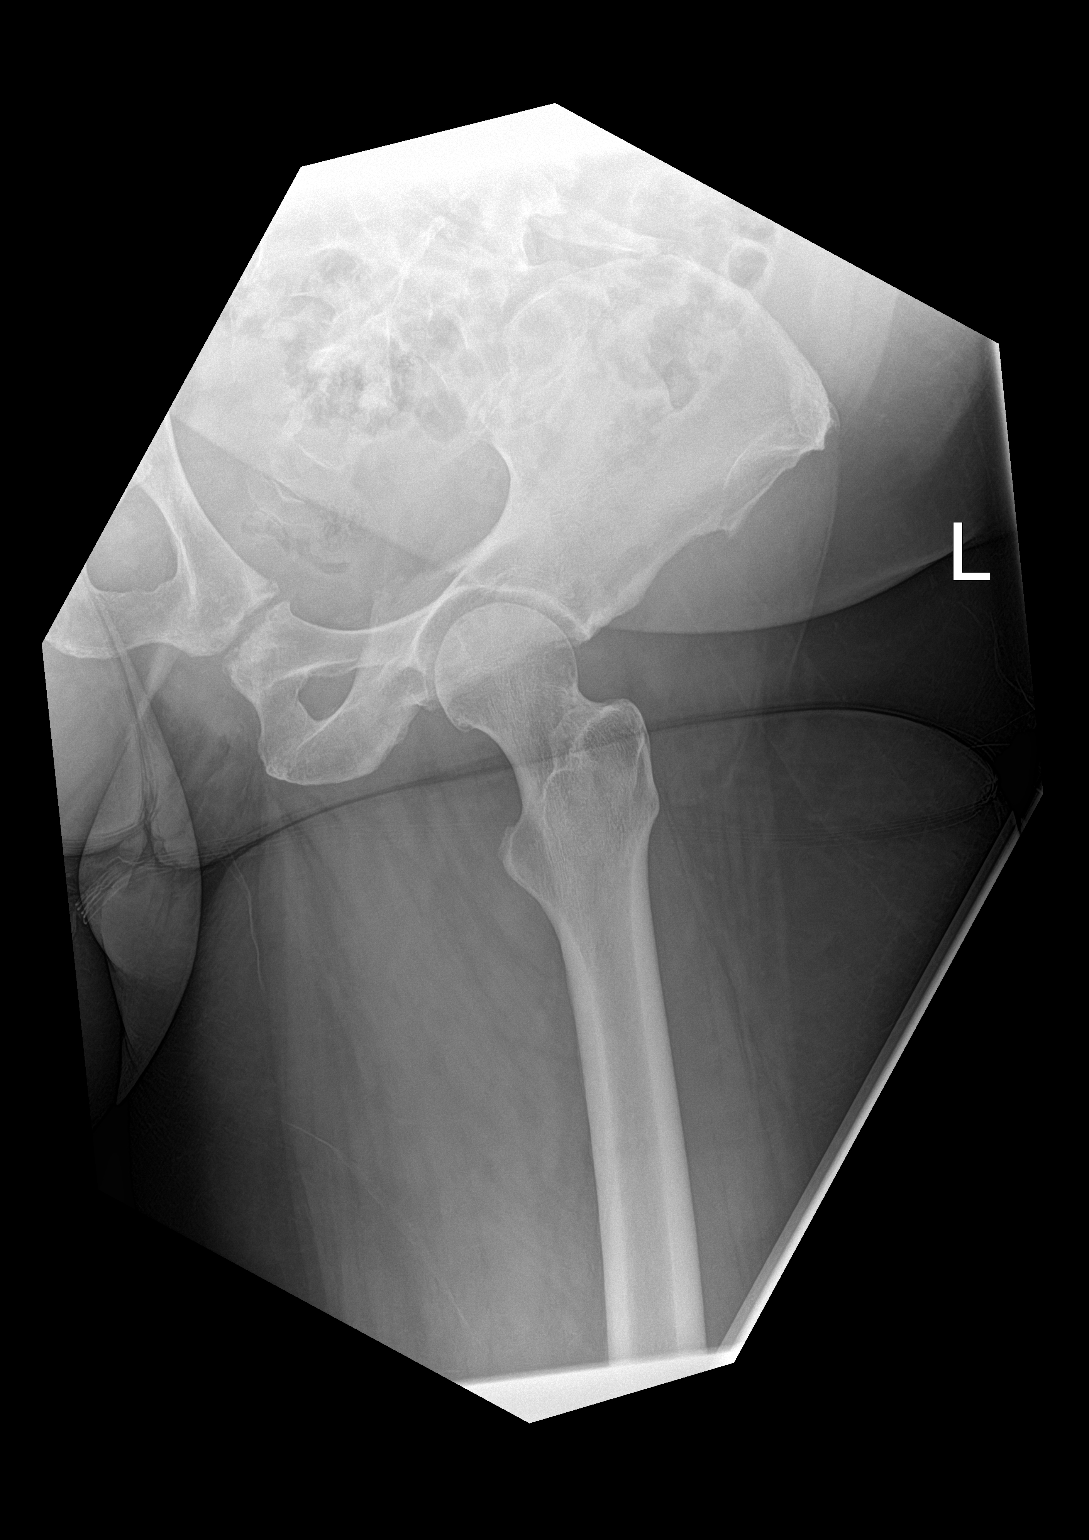

[3 of 3 positions shown; findings below may reference images not displayed]

FINDINGS: There is no evidence of hip fracture or dislocation. There is no
evidence of arthropathy or other focal bone abnormality. Osteitis
pubis noted incidentally.
IMPRESSION: Negative.

## 2021-07-26 ENCOUNTER — Other Ambulatory Visit: Payer: Self-pay

## 2021-07-26 ENCOUNTER — Emergency Department (HOSPITAL_BASED_OUTPATIENT_CLINIC_OR_DEPARTMENT_OTHER)
Admission: EM | Admit: 2021-07-26 | Discharge: 2021-07-26 | Disposition: A | Discharge status: Home / Self Care | Payer: Medicare PPO | Attending: Emergency Medicine | Admitting: Emergency Medicine

## 2021-07-26 ENCOUNTER — Encounter (HOSPITAL_BASED_OUTPATIENT_CLINIC_OR_DEPARTMENT_OTHER): Payer: Self-pay | Admitting: *Deleted

## 2021-07-26 DIAGNOSIS — W272XXA Contact with scissors, initial encounter: Secondary | ICD-10-CM | POA: Insufficient documentation

## 2021-07-26 DIAGNOSIS — S6992XA Unspecified injury of left wrist, hand and finger(s), initial encounter: Secondary | ICD-10-CM | POA: Diagnosis present

## 2021-07-26 DIAGNOSIS — Z23 Encounter for immunization: Secondary | ICD-10-CM | POA: Diagnosis not present

## 2021-07-26 DIAGNOSIS — S61211A Laceration without foreign body of left index finger without damage to nail, initial encounter: Secondary | ICD-10-CM | POA: Diagnosis not present

## 2021-07-26 DIAGNOSIS — Y9222 Religious institution as the place of occurrence of the external cause: Secondary | ICD-10-CM | POA: Diagnosis not present

## 2021-07-26 MED ORDER — TETANUS-DIPHTH-ACELL PERTUSSIS 5-2.5-18.5 LF-MCG/0.5 IM SUSY
0.5000 mL | PREFILLED_SYRINGE | Freq: Once | INTRAMUSCULAR | Status: AC
  Administered 2021-07-26: 0.5 mL via INTRAMUSCULAR
  Filled 2021-07-26: qty 0.5

## 2021-07-26 NOTE — ED Provider Notes (Signed)
[facility] EMERGENCY DEPARTMENT Provider Note   CSN: [id number] Arrival date & time: 07/26/21  1257     History Chief Complaint  Patient presents with   Finger Injury    Shirley Thornton is a 70 y.o. female. With no significant PMH who presents to the emergency department for left index finger injury.   States that this morning she was at [employer] flowers and using gardening scissors when she accidentally caught the side of her left index finger in the scissors. States that she immediately wrapped it up and presented to the emergency department.  Denies numbness or tingling to the finger.  Denies joint pain to the finger.  Denies other injuries.  Not anticoagulated.  HPI     Past Medical History:  Diagnosis Date   Arthritis    Gout     Patient Active Problem List   Diagnosis Date Noted   Low back pain radiating to left leg 03/03/2018   Ceruminosis, bilateral 12/18/2017   Bronchitis 12/18/2017   Allergic reaction 12/18/2017    Past Surgical History:  Procedure Laterality Date   TONSILLECTOMY       OB History   No obstetric history on file.     Family History  Problem Relation Age of Onset   Arthritis Mother    Kidney disease Mother    Lung cancer Father    Stroke Sister    Hypertension Sister    Lung cancer Brother    Heart disease Brother     Social History   Tobacco Use   Smoking status: Never   Smokeless tobacco: Never  Substance Use Topics   Alcohol use: No   Drug use: No    Home Medications Prior to Admission medications   Medication Sig Start Date End Date Taking? Authorizing Provider  acetaminophen (TYLENOL) 500 MG tablet Take 2 tablets (1,000 mg total) by mouth every 8 (eight) hours as needed. 02/23/18   Little, Ambrose Finland, MD  HYDROcodone-acetaminophen (NORCO) 5-325 MG tablet Take 1 tablet by mouth every 6 (six) hours as needed for moderate pain. 03/04/18   Hudnall, Azucena Fallen, MD  meclizine (ANTIVERT) 12.5 MG tablet Take  1 tablet (12.5 mg total) by mouth 3 (three) times daily as needed for dizziness. 12/29/18   Willy Eddy, MD  meloxicam (MOBIC) 7.5 MG tablet Take 1 tablet (7.5 mg total) by mouth daily. 02/24/18   Cristina Gong, PA-C    Allergies    Other  Review of Systems   Review of Systems  Skin:  Positive for wound.  All other systems reviewed and are negative.  Physical Exam Updated Vital Signs BP (!) 132/91 (BP Location: Right Arm)   Pulse 66   Temp 98.7 F (37.1 C) (Oral)   Resp 18   Ht 5\' 7"  (1.702 m)   Wt 77.1 kg   SpO2 100%   BMI 26.63 kg/m   Physical Exam Vitals and nursing note reviewed.  HENT:     Head: Normocephalic and atraumatic.  Eyes:     General: No scleral icterus. Pulmonary:     Effort: Pulmonary effort is normal. No respiratory distress.  Musculoskeletal:        General: Normal range of motion.     Right hand: Normal.     Left hand: Laceration and tenderness present. No swelling, deformity or bony tenderness. Normal range of motion. Normal sensation. Normal capillary refill. Normal pulse.  Skin:    General: Skin is warm and dry.  Capillary Refill: Capillary refill takes less than 2 seconds.     Findings: No rash.  Neurological:     General: No focal deficit present.     Mental Status: She is alert.  Psychiatric:        Mood and Affect: Mood normal.        Behavior: Behavior normal.        Thought Content: Thought content normal.        Judgment: Judgment normal.    ED Results / Procedures / Treatments   Labs (all labs ordered are listed, but only abnormal results are displayed) Labs Reviewed - No data to display  EKG None  Radiology No results found.  Procedures .Marland KitchenLaceration Repair  Date/Time: 07/26/2021 3:44 PM Performed by: Cristopher Peru, PA-C Authorized by: Cristopher Peru, PA-C   Consent:    Consent obtained:  Verbal   Consent given by:  Patient   Risks, benefits, and alternatives were discussed: yes     Risks  discussed:  Infection, pain and need for additional repair   Alternatives discussed:  No treatment Universal protocol:    Procedure explained and questions answered to patient or proxy's satisfaction: yes     Relevant documents present and verified: yes     Patient identity confirmed:  Verbally with patient Anesthesia:    Anesthesia method:  None Laceration details:    Location:  Finger   Finger location:  L index finger   Length (cm):  1   Depth (mm):  0 Pre-procedure details:    Preparation:  Patient was prepped and draped in usual sterile fashion Exploration:    Limited defect created (wound extended): no     Hemostasis achieved with:  Direct pressure   Wound exploration: wound explored through full range of motion and entire depth of wound visualized     Contaminated: no   Treatment:    Area cleansed with:  Povidone-iodine   Amount of cleaning:  Standard   Irrigation solution:  Sterile saline   Irrigation method:  Tap   Debridement:  None   Undermining:  None   Scar revision: no   Skin repair:    Repair method:  Steri-Strips   Number of Steri-Strips:  2 Approximation:    Approximation:  Close Repair type:    Repair type:  Simple Post-procedure details:    Dressing:  Open (no dressing)   Procedure completion:  Tolerated   Medications Ordered in ED Medications  Tdap (BOOSTRIX) injection 0.5 mL (0.5 mLs Intramuscular Given 07/26/21 1548)   ED Course  I have reviewed the triage vital signs and the nursing notes.  Pertinent labs & imaging results that were available during my care of the patient were reviewed by me and considered in my medical decision making (see chart for details).    MDM Rules/Calculators/A&P 70 year old female presents emergency department after laceration to the left index finger. On exam that is a very superficial 1 cm laceration to the left index finger that is hemostatic on initial exam.  She has a small hematoma next to the laceration.  The  skin flap does not evert.  Injury does not need suturing. See procedure note for details however cleaned the wound and examined laceration to the wound base through range of motion.  Required only 2 Steri-Strips. The joints of the finger appear normal without deformity.  She is neurovascularly intact. Tetanus updated. Patient safe for discharge.  She understands wound care. Final Clinical Impression(s) / ED Diagnoses  Final diagnoses:  Laceration of left index finger without foreign body without damage to nail, initial encounter    Rx / DC Orders ED Discharge Orders     None        Cristopher Peru, PA-C 07/26/21 2258    Gwyneth Sprout, MD 07/28/21 1318

## 2021-07-26 NOTE — Discharge Instructions (Signed)
You are seen the emergency department today for a contusion and small laceration to your finger.  It did not require suturing.  I placed some Steri-Strips over the laceration which should be sufficient for healing.  You do not need antibiotics for this.  We updated your tetanus vaccine while you were here.  You may use gentle soap and water for continued cleaning.  I provided you with extra Steri-Strips as needed over the next week or 2 while it is healing.  Please do not scrub the area.  We discussed reasons to return to emergency department including swelling, fever, redness of the finger.  You may also return to the emergency department for any reason.

## 2021-07-26 NOTE — ED Triage Notes (Signed)
C/o right index finger abrasion x 20 mins ago by flower clippers

## 2023-09-23 ENCOUNTER — Telehealth: Payer: Self-pay | Admitting: *Deleted

## 2023-09-23 NOTE — Telephone Encounter (Signed)
Left patient a message that the provider needs to cancel appointment with her and she needs to reschedule with an MD and not a NP.

## 2023-09-24 ENCOUNTER — Encounter: Payer: Medicare PPO | Admitting: Obstetrics and Gynecology

## 2023-10-02 ENCOUNTER — Other Ambulatory Visit (HOSPITAL_COMMUNITY)
Admission: RE | Admit: 2023-10-02 | Discharge: 2023-10-02 | Disposition: A | Discharge status: Home / Self Care | Payer: Medicare PPO | Source: Ambulatory Visit | Attending: Obstetrics and Gynecology | Admitting: Obstetrics and Gynecology

## 2023-10-02 ENCOUNTER — Encounter: Payer: Self-pay | Admitting: Obstetrics and Gynecology

## 2023-10-02 ENCOUNTER — Ambulatory Visit: Payer: Medicare PPO | Admitting: Obstetrics and Gynecology

## 2023-10-02 VITALS — BP 136/77 | HR 61 | Ht 67.0 in | Wt 181.0 lb

## 2023-10-02 DIAGNOSIS — Z01419 Encounter for gynecological examination (general) (routine) without abnormal findings: Secondary | ICD-10-CM | POA: Insufficient documentation

## 2023-10-02 DIAGNOSIS — N362 Urethral caruncle: Secondary | ICD-10-CM | POA: Diagnosis not present

## 2023-10-02 DIAGNOSIS — Z01411 Encounter for gynecological examination (general) (routine) with abnormal findings: Secondary | ICD-10-CM

## 2023-10-02 DIAGNOSIS — Z1151 Encounter for screening for human papillomavirus (HPV): Secondary | ICD-10-CM | POA: Diagnosis not present

## 2023-10-02 MED ORDER — ESTRADIOL 0.1 MG/GM VA CREA: TOPICAL_CREAM | VAGINAL | 12 refills | Status: AC

## 2023-10-02 NOTE — Patient Instructions (Signed)
Urethral caruncle

## 2023-10-02 NOTE — Progress Notes (Addendum)
   ANNUAL EXAM Patient name: Shirley Thornton MRN [id number]  Date of birth: Oct 09, 1951 Chief Complaint:   Annual Exam  History of Present Illness:   Shirley Thornton is a 72 y.o. menopausal G0P0000 being seen today for a routine annual exam.  Current complaints:  Chronic dysuria, no fevers/chills or abdominal pain  Last pap unsure. H/O abnormal pap: no Last mammogram: 11/15/22. Results were: normal Last colonoscopy: 2014. Results were: normal. Has CSY scheduled for December DEXA: Normal 07/18/18     10/04/2015    8:15 AM 07/07/2015    2:12 PM  Depression screen PHQ 2/9  Decreased Interest 0 0  Down, Depressed, Hopeless 0 0  PHQ - 2 Score 0 0         No data to display           Review of Systems:   Pertinent items are noted in HPI Denies any headaches, blurred vision, fatigue, shortness of breath, chest pain, abdominal pain, abnormal vaginal discharge/itching/odor/irritation, problems with periods, bowel movements, urination, or intercourse unless otherwise stated above. Pertinent History Reviewed:  Reviewed past medical,surgical, social and family history.  Reviewed problem list, medications and allergies. Physical Assessment:   Vitals:   10/02/23 0947 10/02/23 1036  BP: (!) 147/73 136/77  Pulse: 67 61  Weight: 181 lb (82.1 kg)   Height: 5\' 7"  (1.702 m)   Body mass index is 28.35 kg/m.        Physical Examination:   General appearance - well appearing, and in no distress  Mental status - alert, oriented to person, place, and time  Chest - respiratory effort normal  Heart - normal peripheral perfusion  Breasts - deferred, f/w PCP  Abdomen - soft, nontender, nondistended, no masses or organomegaly  Pelvic - VULVA: normal appearing vulva with no masses, tenderness or lesions  VAGINA: normal appearing vagina with normal color and discharge, urethral caruncle noted  CERVIX: normal appearing cervix without discharge or lesions, no CMT  Thin prep pap is done with HR HPV  cotesting  UTERUS: uterus is felt to be normal size, shape, consistency and nontender   ADNEXA: No adnexal masses or tenderness noted.  Chaperone present for exam  No results found for this or any previous visit (from the past 24 hour(s)).  Assessment & Plan:  1) Well-Woman Exam Mammogram: due 11/2023, pt prefers to go through PCP Colonoscopy: per GI Pap: Discussed r/b of continuing paps. Lifelong normal paps, reviewed that we could reasonably stop. She would prefer to continue pap.  -     Cytology - PAP  2) Urethral caruncle Recommended UA to r/o UTI but patient had already voided and opted not to wait for repeat UA.  Discussed r/b of vaginal estrogen, pt agreeable to trying IF persistent dysuria needs UA/UCx -     estradiol (ESTRACE VAGINAL) 0.1 MG/GM vaginal cream; Apply a pea sized amount around the opening of the vagina nightly for two weeks, then twice a week after that  Meds:  Meds ordered this encounter  Medications   estradiol (ESTRACE VAGINAL) 0.1 MG/GM vaginal cream    Sig: Apply a pea sized amount around the opening of the vagina nightly for two weeks, then twice a week after that    Dispense:  42.5 g    Refill:  12   Follow-up: Return in about 1 year (around 10/01/2024) for annual exam or sooner as needed.  Lennart Pall, MD 10/02/2023 11:49 AM

## 2023-10-09 LAB — CYTOLOGY - PAP
Comment: NEGATIVE
Comment: NEGATIVE
Comment: NEGATIVE
Diagnosis: NEGATIVE
HPV 16: NEGATIVE
HPV 18 / 45: NEGATIVE
High risk HPV: POSITIVE — AB
